# Patient Record
Sex: Female | Born: 1975 | Race: White | Hispanic: No | Marital: Married | Smoking: Never smoker
Health system: Southern US, Community
[De-identification: ages and names within clinical notes are randomized; demographics above are authoritative.]

## PROBLEM LIST (undated history)

## (undated) DIAGNOSIS — Z87440 Personal history of urinary (tract) infections: Secondary | ICD-10-CM

## (undated) DIAGNOSIS — J45909 Unspecified asthma, uncomplicated: Secondary | ICD-10-CM

## (undated) HISTORY — DX: Personal history of urinary (tract) infections: Z87.440

## (undated) HISTORY — DX: Unspecified asthma, uncomplicated: J45.909

---

## 2009-12-15 HISTORY — PX: HERNIA REPAIR: SHX51

## 2010-03-22 ENCOUNTER — Inpatient Hospital Stay: Payer: Self-pay | Admitting: Obstetrics & Gynecology

## 2010-04-24 ENCOUNTER — Ambulatory Visit: Payer: Self-pay | Admitting: General Surgery

## 2012-03-20 ENCOUNTER — Observation Stay: Payer: Self-pay

## 2012-03-26 ENCOUNTER — Inpatient Hospital Stay: Payer: Self-pay | Admitting: Obstetrics and Gynecology

## 2012-03-26 LAB — CBC WITH DIFFERENTIAL/PLATELET
Basophil #: 0 10*3/uL (ref 0.0–0.1)
Basophil %: 0.3 %
Eosinophil #: 0.1 10*3/uL (ref 0.0–0.7)
HCT: 35.2 % (ref 35.0–47.0)
HGB: 12 g/dL (ref 12.0–16.0)
Lymphocyte #: 1.8 10*3/uL (ref 1.0–3.6)
Lymphocyte %: 17.6 %
MCH: 32.1 pg (ref 26.0–34.0)
Monocyte #: 0.6 x10 3/mm (ref 0.2–0.9)
Monocyte %: 5.7 %
Neutrophil #: 8 10*3/uL — ABNORMAL HIGH (ref 1.4–6.5)
Neutrophil %: 75.9 %
Platelet: 209 10*3/uL (ref 150–440)
RBC: 3.75 10*6/uL — ABNORMAL LOW (ref 3.80–5.20)
RDW: 13.2 % (ref 11.5–14.5)
WBC: 10.5 10*3/uL (ref 3.6–11.0)

## 2013-05-06 ENCOUNTER — Ambulatory Visit: Payer: Self-pay | Admitting: Internal Medicine

## 2013-08-05 ENCOUNTER — Ambulatory Visit: Payer: Self-pay | Admitting: Internal Medicine

## 2013-09-23 ENCOUNTER — Ambulatory Visit: Payer: Self-pay | Admitting: Internal Medicine

## 2014-02-21 ENCOUNTER — Ambulatory Visit: Payer: Self-pay | Admitting: Internal Medicine

## 2014-02-24 ENCOUNTER — Telehealth: Payer: Self-pay | Admitting: Internal Medicine

## 2014-02-24 ENCOUNTER — Encounter: Payer: Self-pay | Admitting: Internal Medicine

## 2014-02-24 ENCOUNTER — Encounter (INDEPENDENT_AMBULATORY_CARE_PROVIDER_SITE_OTHER): Payer: Self-pay

## 2014-02-24 ENCOUNTER — Ambulatory Visit (INDEPENDENT_AMBULATORY_CARE_PROVIDER_SITE_OTHER): Payer: No Typology Code available for payment source | Admitting: Internal Medicine

## 2014-02-24 VITALS — BP 108/70 | HR 46 | Temp 97.9°F | Ht 65.0 in | Wt 134.8 lb

## 2014-02-24 DIAGNOSIS — J45909 Unspecified asthma, uncomplicated: Secondary | ICD-10-CM

## 2014-02-24 DIAGNOSIS — Z13220 Encounter for screening for lipoid disorders: Secondary | ICD-10-CM

## 2014-02-24 DIAGNOSIS — Z1239 Encounter for other screening for malignant neoplasm of breast: Secondary | ICD-10-CM

## 2014-02-24 NOTE — Progress Notes (Signed)
Pre-visit discussion using our clinic review tool. No additional management support is needed unless otherwise documented below in the visit note.  

## 2014-02-24 NOTE — Telephone Encounter (Signed)
At checkout Pt states she is to be scheduled for mammogram per Dr. Lorin PicketScott.  States she has never had one.  Would like to go to [place].  Appt can be Friday afternoons only.

## 2014-02-26 ENCOUNTER — Encounter: Payer: Self-pay | Admitting: Internal Medicine

## 2014-02-26 DIAGNOSIS — J45909 Unspecified asthma, uncomplicated: Secondary | ICD-10-CM | POA: Insufficient documentation

## 2014-02-26 NOTE — Assessment & Plan Note (Signed)
Currently doing well.  Breathing doing well.  Follow.

## 2014-02-26 NOTE — Progress Notes (Signed)
   Subjective:    Patient ID: Megan Frey, female    DOB: 03-17-1976, 38 y.o.   MRN: [id number]  HPI 38 year old female with past history of asthma who comes in today to establish care here at [facility].  I saw [facility] at [facility] approximately 10 years.  She has been doing well.  Has been following with [facility].  Up to date with breast, pelvic and pap smears.  She has been training for marathons.  Long distance running.  No chest pain or tightness. No sob.  No acid reflux.  No nausea or vomiting.  No bowel change.  Has two children now - ages 2 and 4.     Past Medical History  Diagnosis Date  . Asthma   . Hx: UTI (urinary tract infection)     Outpatient Encounter Prescriptions as of 02/24/2014  Medication Sig  . ibuprofen (ADVIL,MOTRIN) 200 MG tablet Take 400 mg by mouth 2 (two) times daily as needed.  . [DISCONTINUED] fexofenadine (ALLEGRA) 60 MG tablet Take 60 mg by mouth 2 (two) times daily.  . [DISCONTINUED] Levonorg-Eth Estrad Triphasic (TRIVORA, 28, PO) Take by mouth.    Review of Systems Patient denies any headache, lightheadedness or dizziness.  No sinus or allergy symptoms.  No chest pain, tightness or palpatitions.  No increased shortness of breath, cough or congestion.  No acid reflux.  No nausea or vomiting.  No abdominal pain or cramping.  No bowel change, such as diarrhea, constipation, BRBPR or melana.  No urine change.  Exercises regularly.  No cardiac symptoms with increased activity or exertion.        Objective:   Physical Exam Filed Vitals:   02/24/14 1332  BP: 108/70  Pulse: 46  Temp: 97.9 F (36.6 C)   Pulse recheck 8160  38 year old female in no acute distress.   HEENT:  Nares- clear.  Oropharynx - without lesions. NECK:  Supple.  Nontender.  No audible bruit.  HEART:  Appears to be regular. LUNGS:  No crackles or wheezing audible.  Respirations even and unlabored.  RADIAL PULSE:  Equal bilaterally.    BREASTS:  Performed by gyn.   ABDOMEN:  Soft,  nontender.  Bowel sounds present and normal.  No audible abdominal bruit.  GU:  Performed by gyn.   EXTREMITIES:  No increased edema present.  DP pulses palpable and equal bilaterally.      Assessment & Plan:  HEALTH MAINTENANCE.  Schedule her for a physical when due.  May still get through gyn.  Check routine labs including cholesterol.  Schedule her for a mammogram.    I spent 25 minutes with the patient and more than 50% of the time was spent in consultation regarding the above.

## 2014-02-28 NOTE — Telephone Encounter (Signed)
Noted  

## 2014-03-01 LAB — HM PAP SMEAR: HM Pap smear: NEGATIVE

## 2014-03-10 ENCOUNTER — Other Ambulatory Visit (INDEPENDENT_AMBULATORY_CARE_PROVIDER_SITE_OTHER): Payer: No Typology Code available for payment source

## 2014-03-10 DIAGNOSIS — J45909 Unspecified asthma, uncomplicated: Secondary | ICD-10-CM

## 2014-03-10 DIAGNOSIS — Z13220 Encounter for screening for lipoid disorders: Secondary | ICD-10-CM

## 2014-03-10 LAB — TSH: TSH: 1.93 u[IU]/mL (ref 0.35–5.50)

## 2014-03-10 LAB — CBC WITH DIFFERENTIAL/PLATELET
BASOS ABS: 0 10*3/uL (ref 0.0–0.1)
BASOS PCT: 0.6 % (ref 0.0–3.0)
EOS ABS: 0.1 10*3/uL (ref 0.0–0.7)
Eosinophils Relative: 1.4 % (ref 0.0–5.0)
HCT: 37 % (ref 36.0–46.0)
HEMOGLOBIN: 12.4 g/dL (ref 12.0–15.0)
LYMPHS PCT: 34.2 % (ref 12.0–46.0)
Lymphs Abs: 2.2 10*3/uL (ref 0.7–4.0)
MCHC: 33.5 g/dL (ref 30.0–36.0)
MCV: 92.1 fl (ref 78.0–100.0)
MONOS PCT: 5.4 % (ref 3.0–12.0)
Monocytes Absolute: 0.3 10*3/uL (ref 0.1–1.0)
NEUTROS ABS: 3.7 10*3/uL (ref 1.4–7.7)
NEUTROS PCT: 58.4 % (ref 43.0–77.0)
Platelets: 255 10*3/uL (ref 150.0–400.0)
RBC: 4.02 Mil/uL (ref 3.87–5.11)
RDW: 13.5 % (ref 11.5–14.6)
WBC: 6.4 10*3/uL (ref 4.5–10.5)

## 2014-03-10 LAB — LIPID PANEL
Cholesterol: 132 mg/dL (ref 0–200)
HDL: 72.8 mg/dL (ref >39.00)
LDL CALC: 55 mg/dL (ref 0–99)
Total CHOL/HDL Ratio: 2
Triglycerides: 22 mg/dL (ref 0.0–149.0)
VLDL: 4.4 mg/dL (ref 0.0–40.0)

## 2014-03-10 LAB — COMPREHENSIVE METABOLIC PANEL
ALBUMIN: 4 g/dL (ref 3.5–5.2)
ALT: 21 U/L (ref 0–35)
AST: 22 U/L (ref 0–37)
Alkaline Phosphatase: 44 U/L (ref 39–117)
BUN: 19 mg/dL (ref 6–23)
CALCIUM: 9.3 mg/dL (ref 8.4–10.5)
CHLORIDE: 105 meq/L (ref 96–112)
CO2: 24 meq/L (ref 19–32)
CREATININE: 0.8 mg/dL (ref 0.4–1.2)
GFR: 87.6 mL/min (ref >60.00)
Glucose, Bld: 91 mg/dL (ref 70–99)
POTASSIUM: 4.3 meq/L (ref 3.5–5.1)
Sodium: 137 mEq/L (ref 135–145)
Total Bilirubin: 1 mg/dL (ref 0.3–1.2)
Total Protein: 6.7 g/dL (ref 6.0–8.3)

## 2014-03-13 ENCOUNTER — Encounter: Payer: Self-pay | Admitting: *Deleted

## 2014-03-14 ENCOUNTER — Encounter: Payer: Self-pay | Admitting: Emergency Medicine

## 2014-03-31 ENCOUNTER — Ambulatory Visit: Payer: Self-pay | Admitting: Internal Medicine

## 2014-03-31 LAB — HM MAMMOGRAPHY: HM Mammogram: NEGATIVE

## 2014-04-03 ENCOUNTER — Encounter: Payer: Self-pay | Admitting: Internal Medicine

## 2014-04-04 ENCOUNTER — Ambulatory Visit: Payer: Self-pay | Admitting: Orthopaedic Surgery

## 2014-04-27 ENCOUNTER — Encounter: Payer: Self-pay | Admitting: Adult Health

## 2014-04-27 ENCOUNTER — Ambulatory Visit (INDEPENDENT_AMBULATORY_CARE_PROVIDER_SITE_OTHER): Payer: No Typology Code available for payment source | Admitting: Adult Health

## 2014-04-27 ENCOUNTER — Ambulatory Visit: Payer: No Typology Code available for payment source | Admitting: Adult Health

## 2014-04-27 VITALS — BP 98/62 | HR 62 | Temp 98.0°F | Resp 14 | Wt 132.5 lb

## 2014-04-27 DIAGNOSIS — N926 Irregular menstruation, unspecified: Secondary | ICD-10-CM

## 2014-04-27 DIAGNOSIS — J029 Acute pharyngitis, unspecified: Secondary | ICD-10-CM

## 2014-04-27 LAB — POCT RAPID STREP A (OFFICE): RAPID STREP A SCREEN: NEGATIVE

## 2014-04-27 MED ORDER — AZITHROMYCIN 250 MG PO TABS: ORAL_TABLET | ORAL | Status: DC

## 2014-04-27 NOTE — Patient Instructions (Signed)
  STart Azithromycin 2 tablets today then 1 tablet daily for the next 4 days.  Gargle with salt water or use chloraseptic spray for your throat.  Continue to drink plenty of fluids.  Call if no improvement within 4-5 days.

## 2014-04-27 NOTE — Progress Notes (Signed)
Pre visit review using our clinic review tool, if applicable. No additional management support is needed unless otherwise documented below in the visit note. 

## 2014-04-27 NOTE — Progress Notes (Signed)
   Subjective:    Patient ID: Megan Frey, female    DOB: 02-Feb-1976, 38 y.o.   MRN: [id number]  HPI Pt presents to clinic with sore throat, ear pain, fever, chills. Symptoms started 2 days ago. 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 She also reports irregular menstrual cycle. Normally only has 3 days but had 8 day cycle. She then bleeding for an additional 3 days 2 days after her period stopped. She reports having an appointment with her GYN in June.  Past Medical History  Diagnosis Date  . Asthma   . Hx: UTI (urinary tract infection)       Review of Systems  Constitutional: Positive for fever and chills.  HENT: Positive for congestion, postnasal drip, rhinorrhea, sinus pressure and sore throat.   Respiratory: Positive for cough. Negative for wheezing.   Genitourinary: Positive for menstrual problem (reports irregular menstrual cycle this month. Had for 8 days then off for 3 and then had additional bleeding for few more days.).  All other systems reviewed and are negative.  BP 98/62  Pulse 62  Temp(Src) 98 F (36.7 C) (Oral)  Resp 14  Wt 132 lb 8 oz (60.102 kg)  SpO2 98%     Objective:   Physical Exam  Constitutional: She is oriented to person, place, and time. She appears well-developed and well-nourished. No distress.  HENT:  Head: Normocephalic and atraumatic.  Right Ear: External ear normal.  Left Ear: External ear normal.  Mouth/Throat: No oropharyngeal exudate.  Cardiovascular: Normal rate, regular rhythm and normal heart sounds.  Exam reveals no gallop.   No murmur heard. Pulmonary/Chest: Effort normal and breath sounds normal. No respiratory distress. She has no wheezes. She has no rales.  Neurological: She is alert and oriented to person, place, and time.  Psychiatric: She has a normal mood and affect. Her behavior is normal. Judgment and thought content normal.       Assessment & Plan:   1. Sore throat Strep test was negative. I am treating  her empirically for strep given her report of strep throughout her son's school and her sore throat, fever. Azithromycin. - POCT rapid strep A  2. Menstrual irregularity Suspect transient, isolated episode. No active problem. She has appointment with her GYN in June. Instructed to follow up with them.

## 2014-05-04 ENCOUNTER — Telehealth: Payer: Self-pay | Admitting: Internal Medicine

## 2014-05-04 NOTE — Telephone Encounter (Signed)
Patient Information:  Caller Name: [name]  Phone: [phone]  Patient: Megan Frey, Megan Frey  Gender: Female  DOB: 11/30/1976  Age: [age] Years  PCP: Dale DurhamScott, Charlene  Pregnant: No  Office Follow Up:  Does the office need to follow up with this patient?: No  Instructions For The Office: N/A   Symptoms  Reason For Call & Symptoms: 04/27/14 she saw Belenda Cruiseachel Rae and was treated for strep and an ear infection. Now she is coughing a lot and blowing green mucous. Afebrile.   She is taking 24 hr Sudaphed. She has a headache.  What else can she do?  Pt disconnected  and recalled and message left.  Reviewed Health History In EMR: Yes  Reviewed Medications In EMR: Yes  Reviewed Allergies In EMR: Yes  Reviewed Surgeries / Procedures: Yes  Date of Onset of Symptoms: 04/29/2014  Treatments Tried: Sudaphed and Ibuprofen  Treatments Tried Worked: No OB / GYN:  LMP: 04/20/2014  Guideline(s) Used:  Colds  Disposition Per Guideline:   See Today or Tomorrow in Office  Reason For Disposition Reached:   Sinus congestion (pressure, fullness) present > 10 days  Advice Given:  N/A  RN Overrode Recommendation:  Document Patient  Pt disconneted and recalled and message left.

## 2014-05-04 NOTE — Telephone Encounter (Signed)
See other encounter with this telephone note, forwarded to Raquel.

## 2014-05-04 NOTE — Telephone Encounter (Signed)
Patient Information:  Caller Name: [name]  Phone: [phone]  Patient: Megan Frey, Megan Frey  Gender: Female  DOB: 1976/02/02  Age: 38 Years  PCP: Dale DurhamScott, Charlene  Pregnant: No  Office Follow Up:  Does the office need to follow up with this patient?: No  Instructions For The Office: N/A   Symptoms  Reason For Call & Symptoms: See ealier note fom 1150 this AM 05/04/14. Pt calling back from previous call hwere she disconnected.  Pt  had strep throat and an ear infection.  She still has a cough and is blowing greenish sinus drainage.  She is taking 24 hr Sudaphed. Afebrile and she has a headache.  What else can she do/take?  Reviewed Health History In EMR: Yes  Reviewed Medications In EMR: Yes  Reviewed Allergies In EMR: No  Reviewed Surgeries / Procedures: N/A  Date of Onset of Symptoms: 04/29/2014  Treatments Tried: Sudaphed and Ibuprofen  Treatments Tried Worked: No OB / GYN:  LMP: 04/20/2014  Guideline(s) Used:  Colds  Disposition Per Guideline:   Home Care  Reason For Disposition Reached:   Colds with no complications  Advice Given:  For a Runny Nose With Profuse Discharge:   Nasal mucus and discharge helps to wash viruses and bacteria out of the nose and sinuses.  Blowing the nose is all that is needed.  If the skin around your nostrils gets irritated, apply a tiny amount of petroleum ointment to the nasal openings once or twice a day.  For a Stuffy Nose - Use Nasal Washes:  Introduction: Saline (salt water) nasal irrigation (nasal wash) is an effective and simple home remedy for treating stuffy nose and sinus congestion. The nose can be irrigated by pouring, spraying, or squirting salt water into the nose and then letting it run back out.  How it Helps: The salt water rinses out excess mucus, washes out any irritants (dust, allergens) that might be present, and moistens the nasal cavity.  Treatment for Associated Symptoms of Colds:  For muscle aches, headaches, or moderate  fever (more than 101 F or 38.9 C): Take acetaminophen every 4 hours.  Cough: Use cough drops.  Hydrate: Drink adequate liquids.  Humidifier:  If the air in your home is dry, use a cool-mist humidifier  Contagiousness:  Wash your hands frequently with soap and water.  Expected Course:   Nasal discharge 7-14 days  Cough up to 2-3 weeks.  Medicines for a Stuffy or Runny Nose:  Most cold medicines that are available over-the-counter (OTC) are not helpful.  Antihistamines: Are only helpful if you also have nasal allergies.  If you have a very runny nose and you really think you need a medicine, you can try using a nasal decongestant for a couple days.  Nasal Decongestants for a Very Stuffy or Runny Nose:  If you have a very stuffy nose, nasal decongestant medicines can shrink the swollen nasal mucosa and allow for easier breathing. If you have a very runny nose, these medicines can reduce the amount of drainage. They may be taken as pills by mouth or as a nasal spray.  Patient Will Follow Care Advice:  YES

## 2015-03-02 ENCOUNTER — Encounter: Payer: Self-pay | Admitting: Internal Medicine

## 2015-03-02 ENCOUNTER — Ambulatory Visit (INDEPENDENT_AMBULATORY_CARE_PROVIDER_SITE_OTHER): Payer: No Typology Code available for payment source | Admitting: Internal Medicine

## 2015-03-02 VITALS — BP 106/58 | HR 58 | Resp 14 | Ht 65.0 in | Wt 133.6 lb

## 2015-03-02 DIAGNOSIS — Z0000 Encounter for general adult medical examination without abnormal findings: Secondary | ICD-10-CM

## 2015-03-02 DIAGNOSIS — J45909 Unspecified asthma, uncomplicated: Secondary | ICD-10-CM | POA: Diagnosis not present

## 2015-03-02 DIAGNOSIS — M25562 Pain in left knee: Secondary | ICD-10-CM | POA: Insufficient documentation

## 2015-03-02 DIAGNOSIS — Z91048 Other nonmedicinal substance allergy status: Secondary | ICD-10-CM | POA: Diagnosis not present

## 2015-03-02 DIAGNOSIS — Z9109 Other allergy status, other than to drugs and biological substances: Secondary | ICD-10-CM

## 2015-03-02 NOTE — Progress Notes (Signed)
Patient ID: Megan Frey, female   DOB: [birth year], 39 y.o.   MRN: [id number]   Subjective:    Patient ID: Megan Frey, female    DOB: 1976/08/26, 39 y.o.   MRN: [id number]  HPI  Patient here for a complete physical exam.  Doing well.  Is having problems with her left knee.  Persistent problem.  Worsened over the last two weeks.  Has seen ortho.  Been to a chiropractor.  Still with increased problems.  Discussed physical therapy referral.  Wants to see Su Hoff - [facility] physical therapy.  Limits her running.  Can ride a bike ok.  Some allergies.  Taking zyrtec.  LMP last Monday.  Sees gyn.  Pap ok.     Past Medical History  Diagnosis Date  . Asthma   . Hx: UTI (urinary tract infection)     Current Outpatient Prescriptions on File Prior to Visit  Medication Sig Dispense Refill  . ibuprofen (ADVIL,MOTRIN) 200 MG tablet Take 400 mg by mouth 2 (two) times daily as needed.     No current facility-administered medications on file prior to visit.    Review of Systems  Constitutional: Negative for appetite change and unexpected weight change.  HENT: Negative for congestion and sinus pressure.   Eyes: Negative for pain and visual disturbance.  Respiratory: Negative for cough, chest tightness and shortness of breath.   Cardiovascular: Negative for chest pain, palpitations and leg swelling.  Gastrointestinal: Negative for nausea, vomiting, abdominal pain and diarrhea.  Genitourinary: Negative for dysuria and difficulty urinating.  Musculoskeletal: Negative for back pain.       Left knee pain as outlined.  Persistent.    Skin: Negative for color change and rash.  Neurological: Negative for dizziness, light-headedness and headaches.  Hematological: Negative for adenopathy. Does not bruise/bleed easily.  Psychiatric/Behavioral: Negative for dysphoric mood and agitation.       Objective:     Pulse 60 Physical Exam  Constitutional: She appears well-developed and well-nourished. No  distress.  HENT:  Nose: Nose normal.  Mouth/Throat: Oropharynx is clear and moist.  Eyes: Right eye exhibits no discharge. Left eye exhibits no discharge. No scleral icterus.  Neck: Neck supple. No thyromegaly present.  Cardiovascular: Normal rate and regular rhythm.   Pulmonary/Chest: Breath sounds normal. No respiratory distress. She has no wheezes.  Abdominal: Soft. Bowel sounds are normal. There is no tenderness.  Musculoskeletal: She exhibits no edema or tenderness.  Lymphadenopathy:    She has no cervical adenopathy.  Skin: No rash noted. No erythema.  Psychiatric: She has a normal mood and affect. Her behavior is normal.    BP 106/58 mmHg  Pulse 58  Resp 14  Ht  (1.651 m)  Wt 133 lb 9.6 oz (60.601 kg)  BMI 22.23 kg/m2  SpO2 99% Wt Readings from Last 3 Encounters:  03/02/15 133 lb 9.6 oz (60.601 kg)  04/27/14 132 lb 8 oz (60.102 kg)  02/24/14 134 lb 12 oz (61.122 kg)     Lab Results  Component Value Date   WBC 6.4 03/10/2014   HGB 12.4 03/10/2014   HCT 37.0 03/10/2014   PLT 255.0 03/10/2014   GLUCOSE 91 03/10/2014   CHOL 132 03/10/2014   TRIG 22.0 03/10/2014   HDL 72.80 03/10/2014   LDLCALC 55 03/10/2014   ALT 21 03/10/2014   AST 22 03/10/2014   NA 137 03/10/2014   K 4.3 03/10/2014   CL 105 03/10/2014   CREATININE 0.8 03/10/2014  BUN 19 03/10/2014   CO2 24 03/10/2014   TSH 1.93 03/10/2014       Assessment & Plan:   Problem List Items Addressed This Visit    Asthma    Breathing stable.        Environmental allergies    Taking zyrtec.  Does not feel she needs anything more.  Follow.        Health care maintenance    Mammogram 03/31/14 - birads I.  Gets her pap smears through gyn (Dr Hinton RaoStadler).        Left knee pain - Primary    Persistent knee pain as outlined.  Has seen ortho.   Has seen chiropractor.  Refer to physical therapy.        Relevant Orders   Ambulatory referral to Physical Therapy     I spent 25 minutes with the patient  and more than 50% of the time was spent in consultation regarding the above.     Dale DurhamSCOTT, Bren Borys, MD

## 2015-03-02 NOTE — Progress Notes (Signed)
Pre visit review using our clinic review tool, if applicable. No additional management support is needed unless otherwise documented below in the visit note. 

## 2015-03-11 ENCOUNTER — Encounter: Payer: Self-pay | Admitting: Internal Medicine

## 2015-03-11 DIAGNOSIS — Z0000 Encounter for general adult medical examination without abnormal findings: Secondary | ICD-10-CM | POA: Insufficient documentation

## 2015-03-11 DIAGNOSIS — Z9109 Other allergy status, other than to drugs and biological substances: Secondary | ICD-10-CM | POA: Insufficient documentation

## 2015-03-11 NOTE — Assessment & Plan Note (Signed)
Persistent knee pain as outlined.  Has seen ortho.   Has seen chiropractor.  Refer to physical therapy.

## 2015-03-11 NOTE — Assessment & Plan Note (Signed)
Mammogram 03/31/14 - birads I.  Gets her pap smears through gyn (Dr Hinton RaoStadler).

## 2015-03-11 NOTE — Assessment & Plan Note (Signed)
Breathing stable.

## 2015-03-11 NOTE — Assessment & Plan Note (Signed)
Taking zyrtec.  Does not feel she needs anything more.  Follow.

## 2015-03-28 ENCOUNTER — Encounter
Admit: 2015-03-28 | Disposition: A | Discharge status: Home / Self Care | Payer: Self-pay | Attending: Internal Medicine | Admitting: Internal Medicine

## 2015-04-18 ENCOUNTER — Encounter: Payer: Self-pay | Admitting: Physical Therapy

## 2015-04-18 ENCOUNTER — Ambulatory Visit: Payer: No Typology Code available for payment source | Attending: Internal Medicine | Admitting: Physical Therapy

## 2015-04-18 DIAGNOSIS — M6281 Muscle weakness (generalized): Secondary | ICD-10-CM | POA: Insufficient documentation

## 2015-04-18 DIAGNOSIS — M25659 Stiffness of unspecified hip, not elsewhere classified: Secondary | ICD-10-CM | POA: Diagnosis not present

## 2015-04-18 DIAGNOSIS — M25562 Pain in left knee: Secondary | ICD-10-CM | POA: Insufficient documentation

## 2015-04-18 NOTE — Patient Instructions (Signed)
Pt issued HEP of: psoas tennis ball release in supine, partial squat on single leg.

## 2015-04-18 NOTE — Therapy (Signed)
[facility] [address] [place], [place], [address] Phone: [phone]   Fax:  [phone]  Physical Therapy Treatment  Patient Details  Name: Derrel NipJaime P Ringel MRN: [id number] Date of Birth: 24-Mar-1976 Referring Provider:  Dale DurhamScott, Charlene, MD  Encounter Date: 04/18/2015      PT End of Session - 04/18/15 1133    Visit Number 4   Number of Visits 16   Date for PT Re-Evaluation 05/09/15   PT Start Time 1030   PT Stop Time 1120   PT Time Calculation (min) 50 min   Activity Tolerance Patient tolerated treatment well      Past Medical History  Diagnosis Date  . Asthma   . Hx: UTI (urinary tract infection)     Past Surgical History  Procedure Laterality Date  . Hernia repair  2011    with mesh    There were no vitals filed for this visit.  Visit Diagnosis:  Muscle weakness  Pain in knee joint, left      Subjective Assessment - 04/18/15 1126    Subjective Pt reports she is doing well, able to run 3 miles with minimal pain. noted pain with performing household cleaning tasks and with work activities.   Currently in Pain? No/denies      Objective: pplyometric depth jumps to assess for knee valgus, pt required cuing to avoid knee valgus and would benefit from continued practice for this. With rotational plyometrics pt unable to control knee valgus with cuing. Single leg squat with toe touch behind, to the side, forward. Unable to perform forward with good control despite cuing.  Trigger point dry needling performed on VMO and distal adductors in multiple sites with piston/twist technique.  Psoas release performed B.  Psoas self release performed with tennis ball and 8 lb wt, with leg lifts x10, heel slide x10.  Pt required extensive cuing with exercises to avoid valgus. Reported instances of knee "feeling weird" and one instance of "tweak" with assessment of rotational plyometrics, improved with manual  cuing.         [facility] PT Assessment - 04/18/15 0001    Assessment   Medical Diagnosis Persistent left knee pain   Onset Date 12/15/14   Next MD Visit 05/10/2015   Prior Therapy none   Precautions   Precautions None   Balance Screen   Has the patient fallen in the past 6 months No   Has the patient had a decrease in activity level because of a fear of falling?  No   Is the patient reluctant to leave their home because of a fear of falling?  No                             PT Education - 04/18/15 1132    Education provided Yes   Education Details HEP   Person(s) Educated Patient   Methods Explanation;Demonstration   Comprehension Verbalized understanding;Returned demonstration             PT Long Term Goals - 04/18/15 1139    PT LONG TERM GOAL #1   Title Pt will be able to run 1 mile with no c/o pain to eturn to typical exercise routine   Time 6   Period Weeks   Status On-going   PT LONG TERM GOAL #2   Title Pt will demonstrate decr. knee valgus with step down to decr. functional difficulty with  elevation changes.   Time 6   Period Weeks   Status On-going   PT LONG TERM GOAL #3   Title Pt will score 75 or greater on LEFS indicating improvement in self reported disability due to LE dysfunction   Time 6   Period Weeks   Status On-going               Plan - 04/18/15 1134    Clinical Impression Statement Pt has not yet returned to her normal level of activity and is still limited with back and knee pain with work activities. In session noted poor motor control with L single leg activities, dynamic control lacking on this side. Pt also appears to be having referred knee pain due to prior psoas pain related to previous hernia surgery. Pt would benefit from continued PT to address these issues.   Pt will benefit from skilled therapeutic intervention in order to improve on the following deficits Decreased strength;Decreased mobility;Pain    Rehab Potential Excellent   PT Frequency 2x / week   PT Duration 8 weeks   PT Treatment/Interventions Manual techniques;Therapeutic exercise   PT Next Visit Plan Focus of next session on running progression, address core stability weakness with work tasks with standing activity.        Problem List Patient Active Problem List   Diagnosis Date Noted  . Environmental allergies 03/11/2015  . Health care maintenance 03/11/2015  . Left knee pain 03/02/2015  . Asthma 02/26/2014    Fisher,Benjamin 04/18/2015, 11:43 AM  [facility] [address] [place], [place], [address] Phone: [phone]   Fax:  [phone]

## 2015-04-24 NOTE — H&P (Signed)
L&D Evaluation:  History Expanded:   HPI [age] uyo G21P1001 whose EDC = 03/25/12.  Pt presents with contractions and [facility].  t already sched for induction next week. Pt is GB S pos.    Gravida 2    Term 1    Living 1    Blood Type O positive    Group B Strep Results (Result >5wks must be treated as unknown) positive    Maternal HIV Negative    Maternal Syphilis Ab Nonreactive    Maternal Varicella Immune    Maternal T-Dap Immune    Presents with contractions    Patient's Medical History No Chronic Illness    Patient's Surgical History hernia    Medications Pre Natal Vitamins    Allergies NKDA    Social History none   Exam:   Vital Signs stable    General no apparent distress    Chest clear    Heart normal sinus rhythm    Abdomen gravid, non-tender    Back no CVAT    Reflexes 1+    Pelvic 4-5    Mebranes Intact   Impression:   Impression early labor   Plan:   Comments Will AROM after antibioitcs in   Electronic Signatures: Towana Badgerosenow, Murl Zogg J (MD)  (Signed 12-Apr-13 12:32)  Authored: L&D Evaluation   Last Updated: 12-Apr-13 12:32 by Towana Badgerosenow, Olamae Ferrara J (MD)

## 2015-04-24 NOTE — H&P (Signed)
L&D Evaluation:  History Expanded:   HPI 39 yo G2P1001 at 6639 2/7 weeks, EDD of 03/25/12 per LMP and 9 week US. [facility] to care, ovarian cyst, +GBS. Pt presents for evaluation of contractions, was seen in office yesterday dilated 3.5 cm and membranes stripped. Cervix unchanged this am with irregular ctx. + FM, no LOF or VB    Blood Type O positive    Group B Strep Results (Result >5wks must be treated as unknown) positive    Maternal HIV Negative    Maternal Syphilis Ab Nonreactive    Maternal Varicella Immune    Rubella Results immune    Maternal T-Dap Unknown    Patient's Medical History No Chronic Illness     Medications Pre Natal Vitamins     Allergies NKDA   ROS:   ROS see HPI   Exam:   Vital Signs stable     General no apparent distress    Mental Status clear     Abdomen gravid, tender with contractions, contractions only painful occasionally    Estimated Fetal Weight Average for gestational age    Fetal Position vertex    Edema no edema     Pelvic no external lesions, 4/80/-2    Mebranes Intact    FHT normal rate with no decels, BL 130, + Accels, moderate variability    Ucx irregular   Impression:   Impression early labor   Plan:   Comments D/c home, return to hospital when ctx increased in intensity or SROM occurs.   Electronic Signatures: Vella KohlerBrothers, Cionna Collantes K (CNM)  (Signed 06-Apr-13 08:55)  Authored: L&D Evaluation   Last Updated: 06-Apr-13 08:55 by Vella KohlerBrothers, Loc Feinstein K (CNM)

## 2015-04-25 ENCOUNTER — Encounter: Payer: No Typology Code available for payment source | Admitting: Physical Therapy

## 2015-05-02 ENCOUNTER — Ambulatory Visit: Payer: No Typology Code available for payment source | Admitting: Physical Therapy

## 2015-05-02 DIAGNOSIS — M25562 Pain in left knee: Secondary | ICD-10-CM | POA: Diagnosis not present

## 2015-05-02 DIAGNOSIS — M6281 Muscle weakness (generalized): Secondary | ICD-10-CM

## 2015-05-02 NOTE — Therapy (Signed)
[facility] [address] [place], [place], [address] Phone: [phone]   Fax:  [phone]  Physical Therapy Treatment  Patient Details  Name: Derrel NipJaime P Brislin MRN: [id number] Date of Birth: Dec 28, 1975 Referring Provider:  Dale DurhamScott, Charlene, MD  Encounter Date: 05/02/2015      PT End of Session - 05/02/15 1147    Visit Number 5   Number of Visits 16   Date for PT Re-Evaluation 05/09/15   PT Start Time 1000   PT Stop Time 1045   PT Time Calculation (min) 45 min   Activity Tolerance Patient tolerated treatment well      Past Medical History  Diagnosis Date  . Asthma   . Hx: UTI (urinary tract infection)     Past Surgical History  Procedure Laterality Date  . Hernia repair  2011    with mesh    There were no vitals filed for this visit.  Visit Diagnosis:  Muscle weakness  Pain in knee joint, left      Subjective Assessment - 05/02/15 1146    Subjective Pt reports she tried running 6 miles, had siginficant incr. in knee pain and has been limited in her tolerance for activity since that time.   Currently in Pain? Yes   Pain Score 5    Pain Location Knee   Pain Orientation Left                Objective: Trigger point dry needling performed on distal adductor, VMO, vastus lateralis with piston technique, local twitch response noted and significant incr. In pain with performance. Following this pt reported decr. Pain with full knee extension compared with start of therapy.  Patellar mobs med/lat, inferior 3x1 min each.  Extensive pt education of how to modify running strategy in order to decr. Pain, pt will return to 3 miles 3x per week, after 2 wks of pain free running will incr. To 4 miles per week, will continue to progress along these lines maintaining pain free status with running.  Educated pt on how to perform self mobs on knee when running to minimize pain during running. Pt  verbalized and demonstrated performance of mobs.  Reviewed exercises, encouraged pt to begin using spinning bike to activate VMO.                      PT Long Term Goals - 04/18/15 1139    PT LONG TERM GOAL #1   Title Pt will be able to run 1 mile with no c/o pain to eturn to typical exercise routine   Time 6   Period Weeks   Status On-going   PT LONG TERM GOAL #2   Title Pt will demonstrate decr. knee valgus with step down to decr. functional difficulty with elevation changes.   Time 6   Period Weeks   Status On-going   PT LONG TERM GOAL #3   Title Pt will score 75 or greater on LEFS indicating improvement in self reported disability due to LE dysfunction   Time 6   Period Weeks   Status On-going               Plan - 05/02/15 1148    Clinical Impression Statement Pt very frustrated that she has not been able to run since trying to run a longer distance. return of global knee pain, stiffness and pain in IT band region. Responded well to manual tx in  session with decr. pain when performing exercise, verbalized understanding of running modification and will  benefit from continued PT to address deficits.   Rehab Potential Excellent   PT Frequency 2x / week   PT Duration 8 weeks   PT Treatment/Interventions Manual techniques;Therapeutic exercise        Problem List Patient Active Problem List   Diagnosis Date Noted  . Environmental allergies 03/11/2015  . Health care maintenance 03/11/2015  . Left knee pain 03/02/2015  . Asthma 02/26/2014    Ty Oshima 05/02/2015, 11:54 AM  [facility] [address] [place], [place], [address] Phone: [phone]   Fax:  [phone]

## 2015-05-09 ENCOUNTER — Ambulatory Visit: Payer: No Typology Code available for payment source | Admitting: Physical Therapy

## 2015-05-09 DIAGNOSIS — M25562 Pain in left knee: Secondary | ICD-10-CM | POA: Diagnosis not present

## 2015-05-09 NOTE — Therapy (Signed)
[place] [facility] [address], [place], [address] Phone: [phone]   Fax:  [phone]  Physical Therapy Treatment  Patient Details  Name: Megan Frey MRN: [id number] Date of Birth: [birth year] Referring Provider:  Einar Pheasant, MD  Encounter Date: 05/09/2015      PT End of Session - 05/09/15 1152    Visit Number 6   Number of Visits 16   Date for PT Re-Evaluation 06/09/15   PT Start Time 1100   PT Stop Time 1145   PT Time Calculation (min) 45 min   Activity Tolerance Patient tolerated treatment well      Past Medical History  Diagnosis Date  . Asthma   . Hx: UTI (urinary tract infection)     Past Surgical History  Procedure Laterality Date  . Hernia repair  2011    with mesh    There were no vitals filed for this visit.  Visit Diagnosis:  Pain in knee joint, left - Plan: PT plan of care cert/re-cert      Subjective Assessment - 05/09/15 1151    Subjective Pt has run twice since previous session, pain continues to be significant following running. She is frustrated at the slow rate of rehab.   Currently in Pain? Yes   Pain Score 5    Pain Location Knee   Pain Orientation Left               Objective: Trigger point dry needling performed on distal quad, distal adductor, distal Vastus lateralis, quadratus lumborum.  Tib/femur PAs 3x1 min grade IV STM on glutes.  Pain with gait improved following TDN, pt better able to peform gait with cuing to achieve full extension following needling, which PT encouraged pt to continue with following session.  Overall pain improved from 5/10 to 1/10 following tx session. PT encouraged pt to d/c running for 2 wks to allow acute onset pain to decr. Pt to be seen for followup at this time when she will be reassessed for return to running program.                  PT Education - 05/09/15 1152    Education provided Yes   Education Details educated pt on taking full running break for the next two weeks. pt will be reassessed at that time.   Person(s) Educated Patient   Methods Explanation;Demonstration   Comprehension Verbalized understanding;Returned demonstration             PT Long Term Goals - 05/09/15 1154    PT LONG TERM GOAL #1   Title Pt will be able to run 1 mile with no c/o pain to eturn to typical exercise routine   Baseline Currently pt is able to do this when in a non-acute phase, however today pt is unable to do so.   Time 6   Period Weeks   Status Partially Met   PT LONG TERM GOAL #2   Title Pt will demonstrate decr. knee valgus with step down to decr. functional difficulty with elevation changes.   Time 6   Period Weeks   Status Partially Met   PT LONG TERM GOAL #3   Title Pt will score 75 or greater on LEFS indicating improvement in self reported disability due to LE dysfunction   Time 6   Period Weeks   Status Deferred  Plan - 05/09/15 1153    Clinical Impression Statement Pt appears to be having acute pain s/p recent run. Overall pain has improved but pt is c/o pain with running consistently. Pain is in knee globally and pt also has pain in IT band region. Pt would benefit from additional round of PT to continue to address pain and progress to return to running program for pain free activity.   Pt will benefit from skilled therapeutic intervention in order to improve on the following deficits Decreased strength;Decreased mobility;Pain   Rehab Potential Excellent   PT Frequency 2x / week   PT Duration 8 weeks   PT Treatment/Interventions Manual techniques;Therapeutic exercise        Problem List Patient Active Problem List   Diagnosis Date Noted  . Environmental allergies 03/11/2015  . Health care maintenance 03/11/2015  . Left knee pain 03/02/2015  . Asthma 02/26/2014    Fisher,Benjamin 05/09/2015, 11:57 AM  [facility] [address], [place], [address] Phone: [phone]   Fax:  [phone]

## 2015-05-23 ENCOUNTER — Ambulatory Visit: Payer: No Typology Code available for payment source | Attending: Internal Medicine | Admitting: Physical Therapy

## 2015-05-23 DIAGNOSIS — M25562 Pain in left knee: Secondary | ICD-10-CM | POA: Insufficient documentation

## 2015-05-23 DIAGNOSIS — M6281 Muscle weakness (generalized): Secondary | ICD-10-CM | POA: Insufficient documentation

## 2015-05-23 NOTE — Therapy (Signed)
[place] [facility] [address] [place], [address] Phone: [phone]   Fax:  [phone]  Physical Therapy Treatment  Patient Details  Name: Megan Frey MRN: [id number] Date of Birth: 23-Sep-1976 Referring Provider:  Einar Pheasant, MD  Encounter Date: 05/23/2015      PT End of Session - 05/23/15 0850    Visit Number 7   Number of Visits 16   Date for PT Re-Evaluation 06/09/15   PT Start Time 0800   PT Stop Time 0850   PT Time Calculation (min) 50 min   Activity Tolerance Patient tolerated treatment well      Past Medical History  Diagnosis Date  . Asthma   . Hx: UTI (urinary tract infection)     Past Surgical History  Procedure Laterality Date  . Hernia repair  2011    with mesh    There were no vitals filed for this visit.  Visit Diagnosis:  Pain in knee joint, left      Subjective Assessment - 05/23/15 0804    Subjective Pt reports she has not run for 2 weeks now. She has had no pain while working out. She had one day of significant pain when she was standing for long periods of time and walking slowly. Pt reports pain is significantly better when she wears a small heel, worse when she wears flats.    Currently in Pain? Yes   Pain Score 2    Pain Location Ankle   Pain Orientation Left                      Objective:  IASTM on L IT band longitudinal and Jstrokes using edge tool.   Ankle joint mobs subtalar, malleolar 3x1 min each. Gentle soft tissue performed on tib anterior. Following this and IASTM pt reported 0/10 pain with standing and slow gait, noted decr. Antalgia with gait.  Dry needling performed on T5 region. CPAs grade IV T4-6. Pt reported pain radiating down leg with performance of this. Over the course of 10 min of manual tx pt reported decr. Resting pain in knee with thoracic PA mobs.  Educated pt on cadence as it relates to running, pt currently runs at  160 BPM, encouraged her per recent research to improve this to 180 BPM and educated pt on how to do this using metronome ap. Pt verbalized understanding of this.           PT Education - 05/23/15 0815    Education provided (p) Yes   Education Details (p) educated pt on changing cadence with running to 180 strides perminute.   Person(s) Educated (p) Patient   Methods (p) Explanation   Comprehension (p) Verbalized understanding             PT Long Term Goals - 05/09/15 1154    PT LONG TERM GOAL #1   Title Pt will be able to run 1 mile with no c/o pain to eturn to typical exercise routine   Baseline Currently pt is able to do this when in a non-acute phase, however today pt is unable to do so.   Time 6   Period Weeks   Status Partially Met   PT LONG TERM GOAL #2   Title Pt will demonstrate decr. knee valgus with step down to decr. functional difficulty with elevation changes.   Time 6   Period Weeks   Status Partially Met  PT LONG TERM GOAL #3   Title Pt will score 75 or greater on LEFS indicating improvement in self reported disability due to LE dysfunction   Time 6   Period Weeks   Status Deferred               Plan - 05/23/15 0850    Clinical Impression Statement Improvement in session with gait and single leg stance with decr. pain from 2/10 to 0/10. Pt has been pain free with exercise but continues to have pain with running. PT issued HEP of cadence change for running, pt will try running 3 miles, 3x per week.    Pt will benefit from skilled therapeutic intervention in order to improve on the following deficits Decreased strength;Decreased mobility;Pain   Rehab Potential Excellent   PT Frequency 2x / week   PT Duration 8 weeks   PT Treatment/Interventions Manual techniques;Therapeutic exercise        Problem List Patient Active Problem List   Diagnosis Date Noted  . Environmental allergies 03/11/2015  . Health care maintenance 03/11/2015  . Left  knee pain 03/02/2015  . Asthma 02/26/2014    Fisher,Benjamin 05/23/2015, 8:56 AM  [place] [address] [place], [address] Phone: [phone]   Fax:  [phone]

## 2015-06-13 ENCOUNTER — Ambulatory Visit: Payer: No Typology Code available for payment source | Admitting: Physical Therapy

## 2015-06-13 DIAGNOSIS — M25562 Pain in left knee: Secondary | ICD-10-CM

## 2015-06-13 DIAGNOSIS — M6281 Muscle weakness (generalized): Secondary | ICD-10-CM

## 2015-06-13 NOTE — Therapy (Signed)
[facility] [address] [place], [address] Phone: [phone]   Fax:  [phone]  Physical Therapy Treatment  Patient Details  Name: Megan Frey MRN: [id number] Date of Birth: 1976-10-13 Referring Provider:  Einar Pheasant, MD  Encounter Date: 06/13/2015      PT End of Session - 06/13/15 1152    Visit Number 8   Number of Visits 16   Date for PT Re-Evaluation 07/09/15   PT Start Time 1100   PT Stop Time 1145   PT Time Calculation (min) 45 min   Activity Tolerance Patient tolerated treatment well      Past Medical History  Diagnosis Date  . Asthma   . Hx: UTI (urinary tract infection)     Past Surgical History  Procedure Laterality Date  . Hernia repair  2011    with mesh    There were no vitals filed for this visit.  Visit Diagnosis:  Pain in knee joint, left  Muscle weakness      Subjective Assessment - 06/13/15 1150    Subjective Pt has been continuing to run. OVer the past few days her pain level has been improved with running but with walking has moderate to high degree of pain. Pt is fearful that this pain is due to a pathoanatomical feature however pain is primarily reproduced with muscle palpation.   Currently in Pain? Yes   Pain Score 3    Pain Location Knee   Pain Orientation Left          Objective: Trigger point dry needling performed on vastus lateralis, proximally and medially, as wellas rectus femoris. This treatment was highly painful for pt - she tolerated it well but c/o pain throughout. Following this pain with gait improved from 4/10 to 1/10, as did pain with knee extension.  Manual patellar mobs 3x1 min. Initially pt significantly hypomobile, with extensive mobs improved to =R.  Re-educated pt on performance of slump stretch, extensive education on posture for seated slump going with one component at a time. Pt performed slump which reproduced pain, cued  to bend knees and flex/extend ankle to address limited ROM while still avoiding pain.                       PT Education - 06/13/15 1152    Education provided Yes   Education Details Educated pt on importance of performing slump stretch.   Person(s) Educated Patient   Methods Explanation   Comprehension Verbalized understanding             PT Long Term Goals - 06/13/15 1201    PT LONG TERM GOAL #1   Title Pt will be able to run 1 mile with no c/o pain to eturn to typical exercise routine   Baseline Currently pt is able to do this when in a non-acute phase, however today pt is unable to do so.   Time 8   Period Weeks   Status Partially Met   PT LONG TERM GOAL #2   Title Pt will demonstrate decr. knee valgus with step down to decr. functional difficulty with elevation changes.   Time 8   Period Weeks   Status Partially Met   PT LONG TERM GOAL #3   Title Pt will score 75 or greater on LEFS indicating improvement in self reported disability due to LE dysfunction   Time 8   Period Weeks  Status Deferred               Plan - 06/13/15 1153    Clinical Impression Statement Pt continues to achieve improvements in pain with running within session, however does continue to be limited and to c/o pain with straightening leg. Able to improve this pain from 4/10 at the start of session to 1/10 via trigger point dry needling, slump stretch and soft tissue work on vastus lateralis and IT band. Pt is being treated every other week with education on how to manage symptoms between sessions. Pt does verbalize concern that she has something more sginificant but has been screened in the past for meniscal pathology which has been negative. Pain currently appears to be related to inflammation in knee from running being compressed when she hyperextends knee.   Pt will benefit from skilled therapeutic intervention in order to improve on the following deficits Decreased  strength;Decreased mobility;Pain   PT Frequency 1x / week   PT Duration 8 weeks   PT Treatment/Interventions Manual techniques;Therapeutic exercise        Problem List Patient Active Problem List   Diagnosis Date Noted  . Environmental allergies 03/11/2015  . Health care maintenance 03/11/2015  . Left knee pain 03/02/2015  . Asthma 02/26/2014    Fisher,Benjamin 06/13/2015, 12:03 PM  [facility] [address] [place], [address] Phone: [phone]   Fax:  [phone]

## 2015-06-27 ENCOUNTER — Ambulatory Visit: Payer: No Typology Code available for payment source | Attending: Internal Medicine | Admitting: Physical Therapy

## 2015-06-27 DIAGNOSIS — M25562 Pain in left knee: Secondary | ICD-10-CM | POA: Diagnosis not present

## 2015-06-27 NOTE — Therapy (Signed)
[facility] [address], [place], [address] Phone: [phone]   Fax:  [phone]  Physical Therapy Treatment  Patient Details  Name: Megan Frey MRN: [id number] Date of Birth: [birth year] Referring Provider:  Einar Pheasant, MD  Encounter Date: 06/27/2015      PT End of Session - 06/27/15 1122    Visit Number 9   Number of Visits 16   PT Start Time 1030   PT Stop Time 1115   PT Time Calculation (min) 45 min   Activity Tolerance Patient tolerated treatment well      Past Medical History  Diagnosis Date  . Asthma   . Hx: UTI (urinary tract infection)     Past Surgical History  Procedure Laterality Date  . Hernia repair  2011    with mesh    There were no vitals filed for this visit.  Visit Diagnosis:  Pain in knee joint, left      Subjective Assessment - 06/27/15 1122    Subjective Pt reports she is doing very well, has done several long runs, has no pain with gait and no pain with running. She did have some knee soreness following most recent 4 mile run but it was short in duration.   Currently in Pain? No/denies            Objective: Trigger point dry needling performed on L vastus lateralis, distal quad, distal adductor.  Following this pt tolerated IT band STM. PT performed extensive STM on IT band, quads.  Knee med/lat mobs 3x20 grade IV.  Following manual tx pt reported no sense of "weakness" in her knee. PT encouraged pt to incr. Running to 6 miles every other day depending on response to treatment. Will be seen for one additional session.                          PT Long Term Goals - 06/13/15 1201    PT LONG TERM GOAL #1   Title Pt will be able to run 1 mile with no c/o pain to eturn to typical exercise routine   Baseline Currently pt is able to do this when in a non-acute phase, however today pt is unable to do so.   Time 8   Period Weeks   Status Partially Met   PT LONG TERM GOAL #2   Title Pt will demonstrate decr. knee valgus with step down to decr. functional difficulty with elevation changes.   Time 8   Period Weeks   Status Partially Met   PT LONG TERM GOAL #3   Title Pt will score 75 or greater on LEFS indicating improvement in self reported disability due to LE dysfunction   Time 8   Period Weeks   Status Deferred               Plan - 06/27/15 1123    Clinical Impression Statement Pt has made definite progress in tolerance for walking and exercise. Pt appears to be nearly appropriate for d/c, wlil be seen for one additional session to ensure she is maintaining improvement.        Problem List Patient Active Problem List   Diagnosis Date Noted  . Environmental allergies 03/11/2015  . Health care maintenance 03/11/2015  . Left knee pain 03/02/2015  . Asthma 02/26/2014    Fisher,Benjamin 06/27/2015, 11:28 AM  [facility] [address] [place], [address] Phone: [phone]   Fax:  [phone]

## 2015-07-19 ENCOUNTER — Ambulatory Visit: Payer: No Typology Code available for payment source | Attending: Internal Medicine | Admitting: Physical Therapy

## 2015-07-19 DIAGNOSIS — M25562 Pain in left knee: Secondary | ICD-10-CM | POA: Insufficient documentation

## 2015-07-19 NOTE — Therapy (Signed)
[facility] [address] [place], [address] Phone: [phone]   Fax:  [phone]  July 19, 2015     Physical Therapy Discharge Summary  Patient: Megan Frey  MRN: [id number]  Date of Birth: May 05, 1976   Diagnosis: Pain in knee joint, left Referring Provider:  Dale Esperance, MD  Pt has made full improvement in pain and ability to run. Pt is able to run greater than 12 miles pain free at high intensity sub 8 min mile. Pt would benefit from continuation of HEP to maintain improvements in pain.        Plan - 07/19/15 1337    Clinical Impression Statement Pt is now pain free and is appropriate for d/c. She is running 14 miles per week pain free. Had multiple questions which PT answered regarding return to higher level running.      Sincerely,   Fisher,Benjamin, PT   CC   [facility] [address], [place], [address] Phone: [phone]   Fax:  [phone]

## 2015-10-16 ENCOUNTER — Encounter: Payer: Self-pay | Admitting: Family Medicine

## 2015-10-16 ENCOUNTER — Ambulatory Visit (INDEPENDENT_AMBULATORY_CARE_PROVIDER_SITE_OTHER): Payer: BLUE CROSS/BLUE SHIELD | Admitting: Family Medicine

## 2015-10-16 VITALS — BP 114/72 | HR 67 | Temp 98.4°F | Ht 65.0 in

## 2015-10-16 DIAGNOSIS — J209 Acute bronchitis, unspecified: Secondary | ICD-10-CM | POA: Diagnosis not present

## 2015-10-16 MED ORDER — BENZONATATE 200 MG PO CAPS: 200.0000 mg | ORAL_CAPSULE | Freq: Two times a day (BID) | ORAL | Status: DC | PRN

## 2015-10-16 MED ORDER — ALBUTEROL SULFATE HFA 108 (90 BASE) MCG/ACT IN AERS
2.0000 | INHALATION_SPRAY | Freq: Four times a day (QID) | RESPIRATORY_TRACT | Status: DC | PRN
Start: 2015-10-16 — End: 2016-01-03

## 2015-10-16 NOTE — Patient Instructions (Signed)
Nice to meet you. We will treat you with albuterol for bronchitis. You can take the tessalon for the cough. If this makes you drowsy please let us know.  If you develop chest pain, shortness of breath, blood in your cough, fever, chills, body aches, or any new symptoms or change in symptoms please seek medical care immediately.

## 2015-10-16 NOTE — Progress Notes (Signed)
Pre visit review using our clinic review tool, if applicable. No additional management support is needed unless otherwise documented below in the visit note. 

## 2015-10-16 NOTE — Progress Notes (Signed)
Patient ID: Megan Frey, female   DOB: 11-Mar-1976, 39 y.o.   MRN: [id number]  Marikay AlarEric Sonnenberg, MD Phone: [phone]  Megan Frey is a [age] y.o. female who presents today for same-day appointment.  Patient notes symptoms started last week with sore throat and postnasal drip. She then started to cough. She infrequently has productive cough with yellow mucus. She does note she was wheezing the last 2 nights and tried Proventil and this helped with this. She has a history of asthma as a child, though has not had an asthma exacerbation or wheezing since she was young. She denies chest pain and shortness of breath. She does have a history of allergies and typically has itchy watery eyes, postnasal drip, and rhinorrhea. She has been taking Claritin and Flonase daily since last week. Her symptoms have persisted. She's not had any fevers with this. She feels well. The cough has kept her up at night.  PMH: nonsmoker.   ROS see history of present illness  Objective  Physical Exam Filed Vitals:   10/16/15 1025  BP: 114/72  Pulse: 67  Temp: 98.4 F (36.9 C)    Physical Exam  Constitutional: She is well-developed, well-nourished, and in no distress.  Well-appearing  HENT:  Head: Normocephalic and atraumatic.  Right Ear: External ear normal.  Left Ear: External ear normal.  Mouth/Throat: No oropharyngeal exudate.  Normal TMs bilaterally, mild posterior oropharynx erythema  Eyes: Conjunctivae are normal. Pupils are equal, round, and reactive to light.  Neck: Neck supple.  Cardiovascular: Normal rate, regular rhythm and normal heart sounds.  Exam reveals no gallop and no friction rub.   No murmur heard. Pulmonary/Chest: Effort normal. No respiratory distress. She has wheezes (has a few scattered infrequent expiratory wheezes). She has no rales.  Musculoskeletal: She exhibits no edema.  No calf tenderness or swelling  Lymphadenopathy:    She has no cervical adenopathy.  Neurological:  She is alert. Gait normal.  Skin: Skin is warm and dry. She is not diaphoretic.     Assessment/Plan: Please see individual problem list.  Acute bronchitis Patients cough likely related to viral bronchitis. Her lungs sound relatively clear with very minimal infrequent wheezes. She is moving air well. We'll treat with albuterol inhaler every 4 hours for the next 2 days and then as needed. She has no focal lung sounds or fever or oxygen desaturation to indicate need for antibiotics. Did discuss chest x-ray with her, though we decided to hold off on this at this time. Treat cough with Tessalon. Given return precautions.    Meds ordered this encounter  Medications  . albuterol (PROVENTIL HFA;VENTOLIN HFA) 108 (90 BASE) MCG/ACT inhaler    Sig: Inhale 2 puffs into the lungs every 6 (six) hours as needed for wheezing or shortness of breath.    Dispense:  1 Inhaler    Refill:  0  . benzonatate (TESSALON) 200 MG capsule    Sig: Take 1 capsule (200 mg total) by mouth 2 (two) times daily as needed for cough.    Dispense:  20 capsule    Refill:  0    Marikay AlarEric Sonnenberg

## 2015-10-17 ENCOUNTER — Telehealth: Payer: Self-pay

## 2015-10-17 NOTE — Assessment & Plan Note (Signed)
Patients cough likely related to viral bronchitis. Her lungs sound relatively clear with very minimal infrequent wheezes. She is moving air well. We'll treat with albuterol inhaler every 4 hours for the next 2 days and then as needed. She has no focal lung sounds or fever or oxygen desaturation to indicate need for antibiotics. Did discuss chest x-ray with her, though we decided to hold off on this at this time. Treat cough with Tessalon. Given return precautions.

## 2015-10-17 NOTE — Telephone Encounter (Signed)
PA for Proventil HFA inhaler, started on Cover my meds.

## 2016-01-03 ENCOUNTER — Ambulatory Visit (INDEPENDENT_AMBULATORY_CARE_PROVIDER_SITE_OTHER): Payer: BLUE CROSS/BLUE SHIELD | Admitting: Family Medicine

## 2016-01-03 ENCOUNTER — Ambulatory Visit
Admission: EM | Admit: 2016-01-03 | Discharge: 2016-01-03 | Disposition: A | Discharge status: Home / Self Care | Payer: BLUE CROSS/BLUE SHIELD | Source: Ambulatory Visit | Attending: Family Medicine | Admitting: Family Medicine

## 2016-01-03 ENCOUNTER — Telehealth: Payer: Self-pay | Admitting: Internal Medicine

## 2016-01-03 ENCOUNTER — Encounter: Payer: Self-pay | Admitting: Family Medicine

## 2016-01-03 ENCOUNTER — Ambulatory Visit
Admission: RE | Admit: 2016-01-03 | Discharge: 2016-01-03 | Disposition: A | Discharge status: Home / Self Care | Payer: BLUE CROSS/BLUE SHIELD | Source: Ambulatory Visit | Attending: Family Medicine | Admitting: Family Medicine

## 2016-01-03 VITALS — BP 118/78 | HR 47 | Temp 97.7°F | Ht 65.0 in | Wt 135.2 lb

## 2016-01-03 DIAGNOSIS — R05 Cough: Secondary | ICD-10-CM | POA: Insufficient documentation

## 2016-01-03 DIAGNOSIS — J4530 Mild persistent asthma, uncomplicated: Secondary | ICD-10-CM

## 2016-01-03 DIAGNOSIS — R059 Cough, unspecified: Secondary | ICD-10-CM

## 2016-01-03 MED ORDER — HYDROCOD POLST-CPM POLST ER 10-8 MG/5ML PO SUER: 5.0000 mL | Freq: Two times a day (BID) | ORAL | Status: DC | PRN

## 2016-01-03 MED ORDER — ALBUTEROL SULFATE HFA 108 (90 BASE) MCG/ACT IN AERS: 2.0000 | INHALATION_SPRAY | Freq: Four times a day (QID) | RESPIRATORY_TRACT | Status: DC | PRN

## 2016-01-03 NOTE — Assessment & Plan Note (Addendum)
New problem. Given associated shortness of breath, chest x-ray was obtained. Chest x-ray was independently reviewed. Interpretation: No focal infiltrate.  Treating cough with Tussionex. Advise continue use of Tessalon as needed.

## 2016-01-03 NOTE — Progress Notes (Signed)
Pre visit review using our clinic review tool, if applicable. No additional management support is needed unless otherwise documented below in the visit note. 

## 2016-01-03 NOTE — Patient Instructions (Signed)
Use the tessalon and the tussionex as directed.  We will call with the results of your chest xray.  Follow up closely with your PCP.  Take care  Dr. Adriana Simas

## 2016-01-03 NOTE — Telephone Encounter (Signed)
Pt would like to have her chest xray results.

## 2016-01-03 NOTE — Assessment & Plan Note (Signed)
Patient with recent cough and shortness of breath with physical activity which she does not normally have. Sending for PFTs for evaluation. PRN Albuterol.

## 2016-01-03 NOTE — Progress Notes (Signed)
Subjective:  Patient ID: Megan Frey, female    DOB: 02-03-1976  Age: 40 y.o. MRN: [id number]  CC: Cough  HPI:  40 year old female with a PMH of asthma presents to clinic today with complaints of cough.  Cough  Patient reports a one-week history of cough.  She states her cough has been productive of discolored mucus.  She reports associated malaise and fatigue.  She's also noted that the past several weeks she's had more difficulty with running (ie SOB).   No associated fevers or chills. She has had sick contacts in her children at home.  She's been using Tessalon with some improvement. She's also restarted Flonase.  No known exacerbating factors.  Of note, patient has not been using her rescue inhaler regularly as it is expensive.  Social Hx   Social History   Social History  . Marital Status: Married    Spouse Name: N/A  . Number of Children: N/A  . Years of Education: N/A   Social History Main Topics  . Smoking status: Never Smoker   . Smokeless tobacco: Never Used  . Alcohol Use: No  . Drug Use: No  . Sexual Activity: Not Asked   Other Topics Concern  . None   Social History Narrative   Review of Systems  Constitutional: Positive for fatigue. Negative for fever and chills.  Respiratory: Positive for cough and shortness of breath.    Objective:  BP 118/78 mmHg  Pulse 47  Temp(Src) 97.7 F (36.5 C) (Oral)  Ht  (1.651 m)  Wt 135 lb 4 oz (61.349 kg)  BMI 22.51 kg/m2  SpO2 99%  LMP 12/25/2015 (Exact Date)  BP/Weight 01/03/2016 10/16/2015 03/02/2015  Systolic BP 118 114 106  Diastolic BP 78 72 58  Wt. (Lbs) 135.25 - 133.6  BMI 22.51 - 22.23   Physical Exam  Constitutional: She is oriented to person, place, and time. She appears well-developed. No distress.  HENT:  Head: Normocephalic and atraumatic.  Mouth/Throat: Oropharynx is clear and moist.  Normal TMs bilaterally.  Cardiovascular: Regular rhythm.  Bradycardia present.     Pulmonary/Chest: Effort normal and breath sounds normal. No respiratory distress. She has no wheezes. She has no rales.  Neurological: She is alert and oriented to person, place, and time.  Psychiatric: She has a normal mood and affect.  Vitals reviewed.  Lab Results  Component Value Date   WBC 6.4 03/10/2014   HGB 12.4 03/10/2014   HCT 37.0 03/10/2014   PLT 255.0 03/10/2014   GLUCOSE 91 03/10/2014   CHOL 132 03/10/2014   TRIG 22.0 03/10/2014   HDL 72.80 03/10/2014   LDLCALC 55 03/10/2014   ALT 21 03/10/2014   AST 22 03/10/2014   NA 137 03/10/2014   K 4.3 03/10/2014   CL 105 03/10/2014   CREATININE 0.8 03/10/2014   BUN 19 03/10/2014   CO2 24 03/10/2014   TSH 1.93 03/10/2014    Assessment & Plan:   Problem List Items Addressed This Visit    Asthma    Patient with recent cough and shortness of breath with physical activity which she does not normally have. Sending for PFTs for evaluation. PRN Albuterol.       Relevant Medications   albuterol (PROVENTIL HFA;VENTOLIN HFA) 108 (90 Base) MCG/ACT inhaler   Other Relevant Orders   Pulmonary Function Test [facility] Only   Cough - Primary    New problem. Given associated shortness of breath, chest x-ray was obtained. Chest x-ray was independently  reviewed. Interpretation: No focal infiltrate.  Treating cough with Tussionex. Advise continue use of Tessalon as needed.      Relevant Medications   chlorpheniramine-HYDROcodone (TUSSIONEX PENNKINETIC ER) 10-8 MG/5ML SUER   Other Relevant Orders   DG Chest 2 View (Completed)      Meds ordered this encounter  Medications  . chlorpheniramine-HYDROcodone (TUSSIONEX PENNKINETIC ER) 10-8 MG/5ML SUER    Sig: Take 5 mLs by mouth every 12 (twelve) hours as needed.    Dispense:  115 mL    Refill:  0  . albuterol (PROVENTIL HFA;VENTOLIN HFA) 108 (90 Base) MCG/ACT inhaler    Sig: Inhale 2 puffs into the lungs every 6 (six) hours as needed for wheezing or shortness of breath.     Dispense:  1 Inhaler    Refill:  0    Follow-up: PRN  Everlene Other DO [facility]

## 2016-01-10 ENCOUNTER — Ambulatory Visit: Payer: BLUE CROSS/BLUE SHIELD | Attending: Family Medicine

## 2016-01-10 DIAGNOSIS — J4530 Mild persistent asthma, uncomplicated: Secondary | ICD-10-CM | POA: Diagnosis not present

## 2016-01-10 DIAGNOSIS — J45909 Unspecified asthma, uncomplicated: Secondary | ICD-10-CM | POA: Diagnosis not present

## 2016-01-21 ENCOUNTER — Telehealth: Payer: Self-pay

## 2016-01-21 NOTE — Telephone Encounter (Signed)
Please notify.

## 2016-01-21 NOTE — Telephone Encounter (Signed)
Spoke with the patient, gave PFT results.

## 2016-01-21 NOTE — Telephone Encounter (Signed)
Normal PFTs.

## 2016-01-21 NOTE — Telephone Encounter (Signed)
Pt called and wanted to know what her PFT results were from 01/10/16. Pease advise

## 2016-01-30 ENCOUNTER — Encounter: Payer: Self-pay | Admitting: Family Medicine

## 2016-01-30 ENCOUNTER — Ambulatory Visit (INDEPENDENT_AMBULATORY_CARE_PROVIDER_SITE_OTHER): Payer: BLUE CROSS/BLUE SHIELD | Admitting: Family Medicine

## 2016-01-30 ENCOUNTER — Telehealth: Payer: Self-pay | Admitting: Internal Medicine

## 2016-01-30 VITALS — BP 106/62 | HR 51 | Temp 98.1°F | Ht 65.0 in | Wt 135.5 lb

## 2016-01-30 DIAGNOSIS — G500 Trigeminal neuralgia: Secondary | ICD-10-CM | POA: Diagnosis not present

## 2016-01-30 MED ORDER — HYDROCODONE-ACETAMINOPHEN 5-325 MG PO TABS: 1.0000 | ORAL_TABLET | Freq: Four times a day (QID) | ORAL | Status: DC | PRN

## 2016-01-30 NOTE — Telephone Encounter (Signed)
Dr. Adriana Simas seeing at 1315. Thanks

## 2016-01-30 NOTE — Assessment & Plan Note (Signed)
New problem. Patient's history suggestive of trigeminal neuralgia. Could potentially be cluster headache but trigeminal neuralgia seems more likely. We discussed treatment options and possible referral to neurology (including pharmacotherapy to for trigeminal neuralgia). Patient preferred to see a neurologist. Patient requested pain medication if it became too severe to prevent her from having to go to the emergency department. Vicodin given for severe pain if needed.

## 2016-01-30 NOTE — Patient Instructions (Signed)
Use the pain medication and ibuprofen as needed.  We will call with the referral.   Trigeminal Neuralgia Trigeminal neuralgia is a nerve disorder that causes attacks of severe facial pain. The attacks last from a few seconds to several minutes. They can happen for days, weeks, or months and then go away for months or years. Trigeminal neuralgia is also called tic douloureux. CAUSES This condition is caused by damage to a nerve in the face that is called the trigeminal nerve. An attack can be triggered by:  Talking.  Chewing.  Putting on makeup.  Washing your face.  Shaving your face.  Brushing your teeth.  Touching your face. RISK FACTORS This condition is more likely to develop in:  Women.  People who are 29 years of age or older. SYMPTOMS The main symptom of this condition is pain in the jaw, lips, eyes, nose, scalp, forehead, and face. The pain may be intense, stabbing, electric, or shock-like. DIAGNOSIS This condition is diagnosed with a physical exam. A CT scan or MRI may be done to rule out other conditions that can cause facial pain. TREATMENT This condition may be treated with:  Avoiding the things that trigger your attacks.  Pain medicine.  Surgery. This may be done in severe cases if other medical treatment does not provide relief. HOME CARE INSTRUCTIONS  Take over-the-counter and prescription medicines only as told by your health care provider.  If you wish to get pregnant, talk with your health care provider before you start trying to get pregnant.  Avoid the things that trigger your attacks. It may help to:  Chew on the unaffected side of your mouth.  Avoid touching your face.  Avoid blasts of hot or cold air. SEEK MEDICAL CARE IF:  Your pain medicine is not helping.  You develop new, unexplained symptoms, such as:  Double vision.  Facial weakness.  Changes in hearing or balance.  You become pregnant. SEEK IMMEDIATE MEDICAL CARE  IF:  Your pain is unbearable, and your pain medicine does not help.   This information is not intended to replace advice given to you by your health care provider. Make sure you discuss any questions you have with your health care provider.   Document Released: 11/28/2000 Document Revised: 08/22/2015 Document Reviewed: 03/26/2015 Elsevier Interactive Patient Education [employer]

## 2016-01-30 NOTE — Progress Notes (Signed)
Subjective:  Patient ID: Megan Frey, female    DOB: 01/05/1976  Age: 40 y.o. MRN: [id number]  CC: Headache  HPI:  40 year old female presents to clinic today with complaints of headache.  Patient states that she's had significant right-sided facial pain around the ear and scalp since 2/3. She states that the pain is severe and lasts briefly. She describes it as a sharp/stabbing pain ("Ice pick").  Recently it has become more frequent and is quite severe. She is taken ibuprofen and used heat with minimal improvement. No reports of pain around the orbits. No known inciting factor. She has seen a chiropractor and was told that she has some neck tension. She states that she is seeing an ENT today to ensure is not related to her ears. No other complaints today.  Social Hx   Social History   Social History  . Marital Status: Married    Spouse Name: N/A  . Number of Children: N/A  . Years of Education: N/A   Social History Main Topics  . Smoking status: Never Smoker   . Smokeless tobacco: Never Used  . Alcohol Use: No  . Drug Use: No  . Sexual Activity: Not Asked   Other Topics Concern  . None   Social History Narrative   Review of Systems  Constitutional: Negative.   Neurological:       R sided headache/facial pain.   Objective:  BP 106/62 mmHg  Pulse 51  Temp(Src) 98.1 F (36.7 C) (Oral)  Ht  (1.651 m)  Wt 135 lb 8 oz (61.462 kg)  BMI 22.55 kg/m2  SpO2 99%  LMP 12/25/2015 (Exact Date)  BP/Weight 01/30/2016 01/03/2016 10/16/2015  Systolic BP 106 118 114  Diastolic BP 62 78 72  Wt. (Lbs) 135.5 135.25 -  BMI 22.55 22.51 -   Physical Exam  Constitutional: She is oriented to person, place, and time. She appears well-developed. No distress.  HENT:  Head: Normocephalic and atraumatic.  Oropharynx clear. Normal TM's bilaterally.    Pulmonary/Chest: Effort normal.  Neurological: She is alert and oriented to person, place, and time.  PEERLA. EOMI. CN  intact. 5/5 muscle strength in all extremities.   Psychiatric: She has a normal mood and affect.  Vitals reviewed.  Lab Results  Component Value Date   WBC 6.4 03/10/2014   HGB 12.4 03/10/2014   HCT 37.0 03/10/2014   PLT 255.0 03/10/2014   GLUCOSE 91 03/10/2014   CHOL 132 03/10/2014   TRIG 22.0 03/10/2014   HDL 72.80 03/10/2014   LDLCALC 55 03/10/2014   ALT 21 03/10/2014   AST 22 03/10/2014   NA 137 03/10/2014   K 4.3 03/10/2014   CL 105 03/10/2014   CREATININE 0.8 03/10/2014   BUN 19 03/10/2014   CO2 24 03/10/2014   TSH 1.93 03/10/2014   Assessment & Plan:   Problem List Items Addressed This Visit    Trigeminal neuralgia - Primary    New problem. Patient's history suggestive of trigeminal neuralgia. Could potentially be cluster headache but trigeminal neuralgia seems more likely. We discussed treatment options and possible referral to neurology (including pharmacotherapy to for trigeminal neuralgia). Patient preferred to see a neurologist. Patient requested pain medication if it became too severe to prevent her from having to go to the emergency department. Vicodin given for severe pain if needed.      Relevant Orders   Ambulatory referral to Neurology      Meds ordered this encounter  Medications  .  Loratadine (CLARITIN) 10 MG CAPS    Sig: Take by mouth.  Marland Kitchen HYDROcodone-acetaminophen (NORCO/VICODIN) 5-325 MG tablet    Sig: Take 1 tablet by mouth every 6 (six) hours as needed for moderate pain.    Dispense:  30 tablet    Refill:  0    Follow-up: PRN  Everlene Other DO [facility]

## 2016-01-30 NOTE — Telephone Encounter (Signed)
Patient Name: Megan Frey  DOB: [birth year]    Initial Comment Caller states starting having a pain on the right side of head, off and on stabbing pains for over a week. The last two have been really bad.    Nurse Assessment  Nurse: Vickey Sages, RN, Jacquilin Date/Time (Eastern Time): 01/30/2016 8:05:13 AM  Confirm and document reason for call. If symptomatic, describe symptoms. You must click the next button to save text entered. ---Caller states starting having a pain on the right side of head, off and on stabbing pains for over a week. The last two have been really bad. NO HEADACHE NOW  Has the patient traveled out of the country within the last 30 days? ---No  Does the patient have any new or worsening symptoms? ---Yes  Will a triage be completed? ---Yes  Related visit to physician within the last 2 weeks? ---No  Does the PT have any chronic conditions? (i.e. diabetes, asthma, etc.) ---Yes  List chronic conditions. ---asthma  Is the patient pregnant or possibly pregnant? (Ask all females between the ages of 87-55) ---No  Is this a behavioral health or substance abuse call? ---No     Guidelines    Guideline Title Affirmed Question Affirmed Notes  Headache Headache (all triage questions negative)    Final Disposition User   See PCP When Office is Open (within 3 days) Vickey Sages, RN, Jacquilin    Comments  PCP did not have any appointments- Made appointment with Adriana Simas at 1315   Referrals  REFERRED TO PCP OFFICE   Disagree/Comply: Comply

## 2016-01-30 NOTE — Progress Notes (Signed)
Pre visit review using our clinic review tool, if applicable. No additional management support is needed unless otherwise documented below in the visit note. 

## 2016-01-30 NOTE — Telephone Encounter (Signed)
Patient has been seen in the office today.

## 2016-01-31 ENCOUNTER — Ambulatory Visit: Payer: Self-pay | Admitting: Neurology

## 2016-02-06 ENCOUNTER — Ambulatory Visit: Payer: Self-pay | Admitting: Neurology

## 2016-02-13 ENCOUNTER — Other Ambulatory Visit: Payer: Self-pay | Admitting: Neurology

## 2016-02-13 DIAGNOSIS — G500 Trigeminal neuralgia: Secondary | ICD-10-CM

## 2016-02-29 ENCOUNTER — Ambulatory Visit
Admission: RE | Admit: 2016-02-29 | Discharge: 2016-02-29 | Disposition: A | Discharge status: Home / Self Care | Payer: BLUE CROSS/BLUE SHIELD | Source: Ambulatory Visit | Attending: Neurology | Admitting: Neurology

## 2016-02-29 DIAGNOSIS — G500 Trigeminal neuralgia: Secondary | ICD-10-CM | POA: Diagnosis present

## 2016-02-29 MED ORDER — GADOBENATE DIMEGLUMINE 529 MG/ML IV SOLN
15.0000 mL | Freq: Once | INTRAVENOUS | Status: AC | PRN
Start: 2016-02-29 — End: 2016-02-29
  Administered 2016-02-29: 12 mL via INTRAVENOUS

## 2016-03-04 ENCOUNTER — Encounter: Payer: Self-pay | Admitting: Internal Medicine

## 2016-03-04 ENCOUNTER — Ambulatory Visit (INDEPENDENT_AMBULATORY_CARE_PROVIDER_SITE_OTHER): Payer: BLUE CROSS/BLUE SHIELD | Admitting: Internal Medicine

## 2016-03-04 VITALS — BP 100/64 | HR 53 | Temp 98.1°F | Resp 18 | Ht 64.5 in | Wt 138.0 lb

## 2016-03-04 DIAGNOSIS — Z0000 Encounter for general adult medical examination without abnormal findings: Secondary | ICD-10-CM | POA: Diagnosis not present

## 2016-03-04 DIAGNOSIS — Z9109 Other allergy status, other than to drugs and biological substances: Secondary | ICD-10-CM

## 2016-03-04 DIAGNOSIS — J45909 Unspecified asthma, uncomplicated: Secondary | ICD-10-CM

## 2016-03-04 DIAGNOSIS — G500 Trigeminal neuralgia: Secondary | ICD-10-CM

## 2016-03-04 DIAGNOSIS — R519 Headache, unspecified: Secondary | ICD-10-CM

## 2016-03-04 DIAGNOSIS — R51 Headache: Secondary | ICD-10-CM

## 2016-03-04 NOTE — Progress Notes (Signed)
Patient ID: Megan Frey, female   DOB: 1976-08-24, 40 y.o.   MRN: [id number]   Subjective:    Patient ID: Megan Frey, female    DOB: 1975-12-22, 40 y.o.   MRN: [id number]  HPI  Patient here for her physical exam.  She was recently diagnosed with trigeminal neuralgia.  Saw Dr Sherryll Burger.  Had MRI.  Reviewed.  Ok.  On magnesium for her mild headaches.  Helping.  Trigeminal neuralgia - pain - better.  Reported some fatigue recently.  Feels better now.  Still running.  Breathing better.  Takes antihistamine prn.  Works.  No abdominal pain or cramping.  Eating well.  No nausea or vomiting.  No diarrhea.     Past Medical History  Diagnosis Date  . Asthma   . Hx: UTI (urinary tract infection)    Past Surgical History  Procedure Laterality Date  . Hernia repair  2011    with mesh   Family History  Problem Relation Age of Onset  . Asthma Father   . Mental illness Sister   . Cancer Maternal Grandfather     colon  . Sudden death Maternal Grandfather   . Cancer Paternal Grandfather     lung   Social History   Social History  . Marital Status: Married    Spouse Name: N/A  . Number of Children: N/A  . Years of Education: N/A   Social History Main Topics  . Smoking status: Never Smoker   . Smokeless tobacco: Never Used  . Alcohol Use: No  . Drug Use: No  . Sexual Activity: Not Asked   Other Topics Concern  . None   Social History Narrative    Outpatient Encounter Prescriptions as of 03/04/2016  Medication Sig  . albuterol (PROVENTIL HFA;VENTOLIN HFA) 108 (90 Base) MCG/ACT inhaler Inhale 2 puffs into the lungs every 6 (six) hours as needed for wheezing or shortness of breath.  . fluticasone (FLONASE) 50 MCG/ACT nasal spray Place into the nose.  . ibuprofen (ADVIL,MOTRIN) 200 MG tablet Take 400 mg by mouth 2 (two) times daily as needed.  . Loratadine (CLARITIN) 10 MG CAPS Take by mouth.  Marland Kitchen MAGNESIUM PO Take by mouth.  . [DISCONTINUED] HYDROcodone-acetaminophen (NORCO/VICODIN)  5-325 MG tablet Take 1 tablet by mouth every 6 (six) hours as needed for moderate pain.   No facility-administered encounter medications on file as of 03/04/2016.    Review of Systems  Constitutional: Negative for appetite change and unexpected weight change.  HENT: Negative for congestion and sinus pressure.   Eyes: Negative for pain and visual disturbance.  Respiratory: Negative for cough, chest tightness and shortness of breath.   Cardiovascular: Negative for chest pain, palpitations and leg swelling.  Gastrointestinal: Negative for nausea, vomiting, abdominal pain and diarrhea.  Genitourinary: Negative for dysuria and difficulty urinating.  Musculoskeletal: Negative for back pain and joint swelling.  Skin: Negative for color change and rash.  Neurological: Positive for headaches. Negative for dizziness and light-headedness.  Hematological: Negative for adenopathy. Does not bruise/bleed easily.  Psychiatric/Behavioral: Negative for dysphoric mood and agitation.       Objective:     Blood pressure rechecked by me:  104/68  Physical Exam  Constitutional: She is oriented to person, place, and time. She appears well-developed and well-nourished. No distress.  HENT:  Nose: Nose normal.  Mouth/Throat: Oropharynx is clear and moist.  Eyes: Right eye exhibits no discharge. Left eye exhibits no discharge. No scleral icterus.  Neck: Neck supple. No  thyromegaly present.  Cardiovascular: Normal rate and regular rhythm.   Pulmonary/Chest: Breath sounds normal. No accessory muscle usage. No tachypnea. No respiratory distress. She has no decreased breath sounds. She has no wheezes. She has no rhonchi. Right breast exhibits no inverted nipple, no mass, no nipple discharge and no tenderness (no axillary adenopathy). Left breast exhibits no inverted nipple, no mass, no nipple discharge and no tenderness (no axilarry adenopathy).  Abdominal: Soft. Bowel sounds are normal. There is no tenderness.    Musculoskeletal: She exhibits no edema or tenderness.  Lymphadenopathy:    She has no cervical adenopathy.  Neurological: She is alert and oriented to person, place, and time.  Skin: Skin is warm. No rash noted. No erythema.  Psychiatric: She has a normal mood and affect. Her behavior is normal.    BP 100/64 mmHg  Pulse 53  Temp(Src) 98.1 F (36.7 C) (Oral)  Resp 18  Ht 5' 4.5" (1.638 m)  Wt 138 lb (62.596 kg)  BMI 23.33 kg/m2  SpO2 98%  LMP 02/19/2016 (Exact Date) Wt Readings from Last 3 Encounters:  03/04/16 138 lb (62.596 kg)  01/30/16 135 lb 8 oz (61.462 kg)  01/03/16 135 lb 4 oz (61.349 kg)     Lab Results  Component Value Date   WBC 6.4 03/10/2014   HGB 12.4 03/10/2014   HCT 37.0 03/10/2014   PLT 255.0 03/10/2014   GLUCOSE 91 03/10/2014   CHOL 132 03/10/2014   TRIG 22.0 03/10/2014   HDL 72.80 03/10/2014   LDLCALC 55 03/10/2014   ALT 21 03/10/2014   AST 22 03/10/2014   NA 137 03/10/2014   K 4.3 03/10/2014   CL 105 03/10/2014   CREATININE 0.8 03/10/2014   BUN 19 03/10/2014   CO2 24 03/10/2014   TSH 1.93 03/10/2014    Mr Brain W Wo Contrast  02/29/2016  CLINICAL DATA:  40 year old female with headaches, right temple and ear region pain. Symptoms since February 3rd. Initial encounter. "Trigeminal neuralgia" with brain protocol specifically requested. EXAM: MRI HEAD WITHOUT AND WITH CONTRAST TECHNIQUE: Multiplanar, multiecho pulse sequences of the brain and surrounding structures were obtained without and with intravenous contrast. CONTRAST:  12mL MULTIHANCE GADOBENATE DIMEGLUMINE 529 MG/ML IV SOLN COMPARISON:  None. FINDINGS: Cerebral volume is within normal limits. No restricted diffusion to suggest acute infarction. No midline shift, mass effect, evidence of mass lesion, ventriculomegaly, extra-axial collection or acute intracranial hemorrhage. Cervicomedullary junction and pituitary are within normal limits. Negative visualized cervical spine. Major intracranial  vascular flow voids are within normal limits. Wallace Cullens and white matter signal is within normal limits for age throughout the brain. No definite encephalomalacia. No chronic cerebral blood products. No abnormal enhancement identified. No dural thickening. The cisternal fifth nerve segments, Meckel cave, and cavernous sinuses appear normal. Visible internal auditory structures appear normal. Tympanic cavities appear clear. Mastoids pneumatization is within normal limits. Normal stylomastoid foramina. Trace ethmoid and left maxillary sinus mucosal thickening. Orbit and scalp soft tissues appear normal. Visualized bone marrow signal is within normal limits. No osseous abnormality identified. IMPRESSION: Normal MRI appearance of the brain. No explanation for headache or ear pain identified. Electronically Signed   By: Odessa Fleming M.D.   On: 02/29/2016 10:27       Assessment & Plan:   Problem List Items Addressed This Visit    Asthma - Primary    Breathing stable.  Uses albuterol prn.  Follow.        Environmental allergies    claritin and  flonase.  Controlled.        Headache    On magnesium. Has helped.  Follow.  Saw Dr Sherryll BurgerShah.       Health care maintenance    Physical today 03/04/16.  Had mammogram 03/31/14 - Birads I.  Gets her pap smears through gyn (Dr Hinton RaoStadler).        Trigeminal neuralgia    Saw neurology.  See note for details.  Pain better.  Taking magnesium.  Follow.            Dale DurhamSCOTT, Lesette Frary, MD

## 2016-03-04 NOTE — Progress Notes (Signed)
Pre-visit discussion using our clinic review tool. No additional management support is needed unless otherwise documented below in the visit note.  

## 2016-03-10 ENCOUNTER — Encounter: Payer: Self-pay | Admitting: Internal Medicine

## 2016-03-10 DIAGNOSIS — R51 Headache: Secondary | ICD-10-CM

## 2016-03-10 DIAGNOSIS — R519 Headache, unspecified: Secondary | ICD-10-CM | POA: Insufficient documentation

## 2016-03-10 NOTE — Assessment & Plan Note (Signed)
Saw neurology.  See note for details.  Pain better.  Taking magnesium.  Follow.

## 2016-03-10 NOTE — Assessment & Plan Note (Signed)
Breathing stable.  Uses albuterol prn.  Follow.   

## 2016-03-10 NOTE — Assessment & Plan Note (Signed)
On magnesium. Has helped.  Follow.  Saw Dr Sherryll BurgerShah.

## 2016-03-10 NOTE — Assessment & Plan Note (Signed)
claritin and flonase.  Controlled.

## 2016-03-10 NOTE — Assessment & Plan Note (Signed)
Physical today 03/04/16.  Had mammogram 03/31/14 - Birads I.  Gets her pap smears through gyn (Dr Hinton RaoStadler).

## 2016-10-30 ENCOUNTER — Encounter: Payer: Self-pay | Admitting: Family

## 2016-10-30 ENCOUNTER — Ambulatory Visit (INDEPENDENT_AMBULATORY_CARE_PROVIDER_SITE_OTHER): Payer: BLUE CROSS/BLUE SHIELD | Admitting: Family

## 2016-10-30 VITALS — BP 110/70 | HR 53 | Temp 98.7°F | Ht 64.5 in | Wt 140.2 lb

## 2016-10-30 DIAGNOSIS — J209 Acute bronchitis, unspecified: Secondary | ICD-10-CM

## 2016-10-30 MED ORDER — AZITHROMYCIN 250 MG PO TABS
ORAL_TABLET | ORAL | 0 refills | Status: DC
Start: 2016-10-30 — End: 2017-03-05

## 2016-10-30 MED ORDER — ALBUTEROL SULFATE HFA 108 (90 BASE) MCG/ACT IN AERS: 2.0000 | INHALATION_SPRAY | Freq: Four times a day (QID) | RESPIRATORY_TRACT | 1 refills | Status: DC | PRN

## 2016-10-30 MED ORDER — AZITHROMYCIN 250 MG PO TABS: ORAL_TABLET | ORAL | 0 refills | Status: DC

## 2016-10-30 NOTE — Progress Notes (Signed)
Subjective:    Patient ID: Megan Frey, female    DOB: [birth year], 40 y.o.   MRN: [id number]  CC: Megan Frey is a 40 y.o. female who presents today for an acute visit.    HPI: Patient here for acute visit with chief complaint of productive cough, congestion for past week and half. Unchanged. Children have been sick.  Endorses SOB, wheezing, HA.  No fevers, chills. History of asthma and seasonal allergies.  Congestion and cough worsening facial pain; diagnosis of trigiminal neuralgia. Has tried mucinex and robitussin without much relief.     HISTORY:  Past Medical History:  Diagnosis Date  . Asthma   . Hx: UTI (urinary tract infection)    Past Surgical History:  Procedure Laterality Date  . HERNIA REPAIR  2011   with mesh   Family History  Problem Relation Age of Onset  . Asthma Father   . Mental illness Sister   . Cancer Maternal Grandfather     colon  . Sudden death Maternal Grandfather   . Cancer Paternal Grandfather     lung    Allergies: Patient has no known allergies. Current Outpatient Prescriptions on File Prior to Visit  Medication Sig Dispense Refill  . fluticasone (FLONASE) 50 MCG/ACT nasal spray Place into the nose.    . ibuprofen (ADVIL,MOTRIN) 200 MG tablet Take 400 mg by mouth 2 (two) times daily as needed.    . Loratadine (CLARITIN) 10 MG CAPS Take by mouth.    Marland Kitchen. MAGNESIUM PO Take by mouth.     No current facility-administered medications on file prior to visit.     Social History  Substance Use Topics  . Smoking status: Never Smoker  . Smokeless tobacco: Never Used  . Alcohol use No    Review of Systems  Constitutional: Negative for chills and fever.  HENT: Positive for congestion.   Respiratory: Positive for cough, shortness of breath and wheezing.   Cardiovascular: Negative for chest pain and palpitations.  Gastrointestinal: Negative for nausea and vomiting.  Neurological: Positive for headaches.      Objective:    BP 110/70    Pulse (!) 53   Temp 98.7 F (37.1 C) (Oral)   Ht 5' 4.5" (1.638 m)   Wt 140 lb 3.2 oz (63.6 kg)   SpO2 98%   BMI 23.69 kg/m    Physical Exam  Constitutional: She appears well-developed and well-nourished.  HENT:  Head: Normocephalic and atraumatic.  Right Ear: Hearing, tympanic membrane, external ear and ear canal normal. No drainage, swelling or tenderness. No foreign bodies. Tympanic membrane is not erythematous and not bulging. No middle ear effusion. No decreased hearing is noted.  Left Ear: Hearing, tympanic membrane, external ear and ear canal normal. No drainage, swelling or tenderness. No foreign bodies. Tympanic membrane is not erythematous and not bulging.  No middle ear effusion. No decreased hearing is noted.  Nose: Nose normal. No rhinorrhea. Right sinus exhibits no maxillary sinus tenderness and no frontal sinus tenderness. Left sinus exhibits no maxillary sinus tenderness and no frontal sinus tenderness.  Mouth/Throat: Uvula is midline, oropharynx is clear and moist and mucous membranes are normal. No oropharyngeal exudate, posterior oropharyngeal edema, posterior oropharyngeal erythema or tonsillar abscesses.  Eyes: Conjunctivae are normal.  Cardiovascular: Regular rhythm, normal heart sounds and normal pulses.   Pulmonary/Chest: Effort normal and breath sounds normal. She has no wheezes. She has no rhonchi. She has no rales.  Lymphadenopathy:  Head (right side): No submental, no submandibular, no tonsillar, no preauricular, no posterior auricular and no occipital adenopathy present.       Head (left side): No submental, no submandibular, no tonsillar, no preauricular, no posterior auricular and no occipital adenopathy present.    She has no cervical adenopathy.  Neurological: She is alert.  Skin: Skin is warm and dry.  Psychiatric: She has a normal mood and affect. Her speech is normal and behavior is normal. Thought content normal.  Vitals reviewed.        Assessment & Plan:  1. Acute bronchitis, unspecified organism Afebrile. No adventitious lung sounds. Sa02 98. Based on duration of symptoms, patient and I jointly agreed to start antibiotic therapy. Encouraged use of albuterol particularly before she runs due to ahistory of asthma. Patient will let me know should she is not better, we will consider a short course of prednisone at that time.  - azithromycin (ZITHROMAX) 250 MG tablet; Tale 500 mg PO on day 1, then 250 mg PO q24h x 4 days.  Dispense: 6 tablet; Refill: 0 - albuterol (PROVENTIL HFA) 108 (90 Base) MCG/ACT inhaler; Inhale 2 puffs into the lungs every 6 (six) hours as needed for wheezing or shortness of breath.  Dispense: 1 Inhaler; Refill: 1      I have discontinued Ms. Gilmer's albuterol. I am also having her maintain her ibuprofen, Loratadine, fluticasone, and MAGNESIUM PO.   No orders of the defined types were placed in this encounter.   Return precautions given.   Risks, benefits, and alternatives of the medications and treatment plan prescribed today were discussed, and patient expressed understanding.   Education regarding symptom management and diagnosis given to patient on AVS.  Continue to follow with Dale DurhamSCOTT, CHARLENE, MD for routine health maintenance.   Cameron ProudJaime P Hedger and I agreed with plan.   Rennie PlowmanMargaret Nitara Szczerba, FNP

## 2016-10-30 NOTE — Patient Instructions (Signed)
Pleasure seeing you. Please let me know if you're not better. Megan Frey

## 2016-10-30 NOTE — Progress Notes (Signed)
Pre visit review using our clinic review tool, if applicable. No additional management support is needed unless otherwise documented below in the visit note. 

## 2017-03-05 ENCOUNTER — Encounter: Payer: Self-pay | Admitting: Internal Medicine

## 2017-03-05 ENCOUNTER — Ambulatory Visit (INDEPENDENT_AMBULATORY_CARE_PROVIDER_SITE_OTHER): Payer: BLUE CROSS/BLUE SHIELD | Admitting: Internal Medicine

## 2017-03-05 VITALS — BP 118/72 | HR 50 | Temp 98.8°F | Resp 16 | Ht 64.5 in | Wt 140.6 lb

## 2017-03-05 DIAGNOSIS — J209 Acute bronchitis, unspecified: Secondary | ICD-10-CM

## 2017-03-05 DIAGNOSIS — Z0000 Encounter for general adult medical examination without abnormal findings: Secondary | ICD-10-CM | POA: Diagnosis not present

## 2017-03-05 DIAGNOSIS — G500 Trigeminal neuralgia: Secondary | ICD-10-CM

## 2017-03-05 DIAGNOSIS — Z13220 Encounter for screening for lipoid disorders: Secondary | ICD-10-CM | POA: Diagnosis not present

## 2017-03-05 DIAGNOSIS — Z124 Encounter for screening for malignant neoplasm of cervix: Secondary | ICD-10-CM | POA: Diagnosis not present

## 2017-03-05 MED ORDER — ALBUTEROL SULFATE HFA 108 (90 BASE) MCG/ACT IN AERS: 2.0000 | INHALATION_SPRAY | Freq: Four times a day (QID) | RESPIRATORY_TRACT | 1 refills | Status: AC | PRN

## 2017-03-05 MED ORDER — FLUTICASONE PROPIONATE 50 MCG/ACT NA SUSP: 2.0000 | Freq: Every day | NASAL | 2 refills | Status: AC

## 2017-03-05 NOTE — Progress Notes (Signed)
Patient ID: CAMAY PEDIGO, female   DOB: Sep 06, 1976, 41 y.o.   MRN: [id number]   Subjective:    Patient ID: [name], female    DOB: [birth year], 41 y.o.   MRN: [id number]  HPI  Patient here for her physical exam.  States she is doing well.  Her trigeminal neuralgia is not a significant issue for her now.  No headache.  She is exercising regularly.  No chest pain.  No sob.  No acid reflux.  No abdominal pain or cramping.  Bowels stable.  Regular periods.  LMP last week.    Past Medical History:  Diagnosis Date  . Asthma   . Hx: UTI (urinary tract infection)    Past Surgical History:  Procedure Laterality Date  . HERNIA REPAIR  2011   with mesh   Family History  Problem Relation Age of Onset  . Asthma Father   . Mental illness Sister   . Cancer Maternal Grandfather     colon  . Sudden death Maternal Grandfather   . Cancer Paternal Grandfather     lung   Social History   Social History  . Marital status: Married    Spouse name: N/A  . Number of children: N/A  . Years of education: N/A   Social History Main Topics  . Smoking status: Never Smoker  . Smokeless tobacco: Never Used  . Alcohol use No  . Drug use: No  . Sexual activity: Not Asked   Other Topics Concern  . None   Social History Narrative  . None    Outpatient Encounter Prescriptions as of 03/05/2017  Medication Sig  . albuterol (PROVENTIL HFA) 108 (90 Base) MCG/ACT inhaler Inhale 2 puffs into the lungs every 6 (six) hours as needed for wheezing or shortness of breath.  . fluticasone (FLONASE) 50 MCG/ACT nasal spray Place 2 sprays into both nostrils daily.  Marland Kitchen ibuprofen (ADVIL,MOTRIN) 200 MG tablet Take 400 mg by mouth 2 (two) times daily as needed.  . Loratadine (CLARITIN) 10 MG CAPS Take by mouth.  Marland Kitchen MAGNESIUM PO Take by mouth.  . [DISCONTINUED] albuterol (PROVENTIL HFA) 108 (90 Base) MCG/ACT inhaler Inhale 2 puffs into the lungs every 6 (six) hours as needed for wheezing or shortness of breath.    . [DISCONTINUED] fluticasone (FLONASE) 50 MCG/ACT nasal spray Place into the nose.  . [DISCONTINUED] azithromycin (ZITHROMAX) 250 MG tablet Tale 500 mg PO on day 1, then 250 mg PO q24h x 4 days. (Patient not taking: Reported on 03/05/2017)   No facility-administered encounter medications on file as of 03/05/2017.     Review of Systems  Constitutional: Negative for appetite change and unexpected weight change.  HENT: Negative for congestion, sinus pressure and sore throat.   Eyes: Negative for pain and visual disturbance.  Respiratory: Negative for cough, chest tightness and shortness of breath.   Cardiovascular: Negative for chest pain, palpitations and leg swelling.  Gastrointestinal: Negative for abdominal pain, diarrhea, nausea and vomiting.  Genitourinary: Negative for difficulty urinating and dysuria.  Musculoskeletal: Negative for back pain and joint swelling.  Skin: Negative for color change and rash.  Neurological: Negative for dizziness, light-headedness and headaches.  Hematological: Negative for adenopathy. Does not bruise/bleed easily.  Psychiatric/Behavioral: Negative for agitation and dysphoric mood.       Objective:    Physical Exam  Constitutional: She is oriented to person, place, and time. She appears well-developed and well-nourished. No distress.  HENT:  Nose: Nose normal.  Mouth/Throat: Oropharynx  is clear and moist.  Eyes: Right eye exhibits no discharge. Left eye exhibits no discharge. No scleral icterus.  Neck: Neck supple. No thyromegaly present.  Cardiovascular: Normal rate and regular rhythm.   Pulmonary/Chest: Breath sounds normal. No accessory muscle usage. No tachypnea. No respiratory distress. She has no decreased breath sounds. She has no wheezes. She has no rhonchi. Right breast exhibits no inverted nipple, no mass, no nipple discharge and no tenderness (no axillary adenopathy). Left breast exhibits no inverted nipple, no mass, no nipple discharge and  no tenderness (no axilarry adenopathy).  Abdominal: Soft. Bowel sounds are normal. There is no tenderness.  Genitourinary:  Genitourinary Comments: Normal external genitalia.  Vaginal vault without lesions.  Cervix identified.  Pap smear performed.  Could not appreciate any adnexal masses or tenderness.    Musculoskeletal: She exhibits no edema or tenderness.  Lymphadenopathy:    She has no cervical adenopathy.  Neurological: She is alert and oriented to person, place, and time.  Skin: Skin is warm. No rash noted. No erythema.  Psychiatric: She has a normal mood and affect. Her behavior is normal.    BP 118/72 (BP Location: Left Arm, Patient Position: Sitting, Cuff Size: Normal)   Pulse (!) 50   Temp 98.8 F (37.1 C) (Oral)   Resp 16   Ht 5' 4.5" (1.638 m)   Wt 140 lb 9.6 oz (63.8 kg)   LMP 02/25/2017   SpO2 99%   BMI 23.76 kg/m  Wt Readings from Last 3 Encounters:  03/05/17 140 lb 9.6 oz (63.8 kg)  10/30/16 140 lb 3.2 oz (63.6 kg)  03/04/16 138 lb (62.6 kg)     Lab Results  Component Value Date   WBC 6.4 03/10/2014   HGB 12.4 03/10/2014   HCT 37.0 03/10/2014   PLT 255.0 03/10/2014   GLUCOSE 91 03/10/2014   CHOL 132 03/10/2014   TRIG 22.0 03/10/2014   HDL 72.80 03/10/2014   LDLCALC 55 03/10/2014   ALT 21 03/10/2014   AST 22 03/10/2014   NA 137 03/10/2014   K 4.3 03/10/2014   CL 105 03/10/2014   CREATININE 0.8 03/10/2014   BUN 19 03/10/2014   CO2 24 03/10/2014   TSH 1.93 03/10/2014    Mr Brain W ZO Contrast  Result Date: 02/29/2016 CLINICAL DATA:  41 year old female with headaches, right temple and ear region pain. Symptoms since February 3rd. Initial encounter. "Trigeminal neuralgia" with brain protocol specifically requested. EXAM: MRI HEAD WITHOUT AND WITH CONTRAST TECHNIQUE: Multiplanar, multiecho pulse sequences of the brain and surrounding structures were obtained without and with intravenous contrast. CONTRAST:  12mL MULTIHANCE GADOBENATE DIMEGLUMINE 529  MG/ML IV SOLN COMPARISON:  None. FINDINGS: Cerebral volume is within normal limits. No restricted diffusion to suggest acute infarction. No midline shift, mass effect, evidence of mass lesion, ventriculomegaly, extra-axial collection or acute intracranial hemorrhage. Cervicomedullary junction and pituitary are within normal limits. Negative visualized cervical spine. Major intracranial vascular flow voids are within normal limits. Wallace Cullens and white matter signal is within normal limits for age throughout the brain. No definite encephalomalacia. No chronic cerebral blood products. No abnormal enhancement identified. No dural thickening. The cisternal fifth nerve segments, Meckel cave, and cavernous sinuses appear normal. Visible internal auditory structures appear normal. Tympanic cavities appear clear. Mastoids pneumatization is within normal limits. Normal stylomastoid foramina. Trace ethmoid and left maxillary sinus mucosal thickening. Orbit and scalp soft tissues appear normal. Visualized bone marrow signal is within normal limits. No osseous abnormality identified. IMPRESSION: Normal MRI  appearance of the brain. No explanation for headache or ear pain identified. Electronically Signed   By: Odessa FlemingH  Hall M.D.   On: 02/29/2016 10:27       Assessment & Plan:   Problem List Items Addressed This Visit    Health care maintenance    Physical today 03/05/17.        Trigeminal neuralgia    Saw neurology.  Not a significant issue for her now.  Follow.        Relevant Orders   CBC with Differential/Platelet   Comprehensive metabolic panel   TSH    Other Visit Diagnoses    Routine general medical examination at a health care facility    -  Primary   Acute bronchitis, unspecified organism       Relevant Medications   albuterol (PROVENTIL HFA) 108 (90 Base) MCG/ACT inhaler   Screening cholesterol level       Relevant Orders   Lipid panel       Dale DurhamSCOTT, Mailen Newborn, MD

## 2017-03-05 NOTE — Progress Notes (Signed)
Pre-visit discussion using our clinic review tool. No additional management support is needed unless otherwise documented below in the visit note.  

## 2017-03-07 ENCOUNTER — Encounter: Payer: Self-pay | Admitting: Internal Medicine

## 2017-03-07 NOTE — Assessment & Plan Note (Signed)
Saw neurology.  Not a significant issue for her now.  Follow.

## 2017-03-07 NOTE — Assessment & Plan Note (Signed)
Physical today 03/05/17.

## 2017-03-09 ENCOUNTER — Other Ambulatory Visit (HOSPITAL_COMMUNITY)
Admission: RE | Admit: 2017-03-09 | Discharge: 2017-03-09 | Disposition: A | Discharge status: Home / Self Care | Payer: BLUE CROSS/BLUE SHIELD | Source: Ambulatory Visit | Attending: Internal Medicine | Admitting: Internal Medicine

## 2017-03-09 DIAGNOSIS — Z124 Encounter for screening for malignant neoplasm of cervix: Secondary | ICD-10-CM | POA: Diagnosis not present

## 2017-03-09 NOTE — Addendum Note (Signed)
Addended by: Donnamarie PoagHOMPSON, Kynsie Falkner Y on: 03/09/2017 09:16 AM   Modules accepted: Orders

## 2017-03-10 LAB — CYTOLOGY - PAP
DIAGNOSIS: NEGATIVE
HPV: NOT DETECTED

## 2017-03-12 ENCOUNTER — Other Ambulatory Visit (INDEPENDENT_AMBULATORY_CARE_PROVIDER_SITE_OTHER): Payer: BLUE CROSS/BLUE SHIELD

## 2017-03-12 DIAGNOSIS — Z13220 Encounter for screening for lipoid disorders: Secondary | ICD-10-CM

## 2017-03-12 DIAGNOSIS — G500 Trigeminal neuralgia: Secondary | ICD-10-CM | POA: Diagnosis not present

## 2017-03-12 LAB — CBC WITH DIFFERENTIAL/PLATELET
BASOS PCT: 0.5 % (ref 0.0–3.0)
Basophils Absolute: 0 10*3/uL (ref 0.0–0.1)
EOS PCT: 0.7 % (ref 0.0–5.0)
Eosinophils Absolute: 0.1 10*3/uL (ref 0.0–0.7)
HCT: 37.1 % (ref 36.0–46.0)
Hemoglobin: 12.4 g/dL (ref 12.0–15.0)
LYMPHS ABS: 2 10*3/uL (ref 0.7–4.0)
Lymphocytes Relative: 21.7 % (ref 12.0–46.0)
MCHC: 33.4 g/dL (ref 30.0–36.0)
MCV: 91.9 fl (ref 78.0–100.0)
MONO ABS: 0.6 10*3/uL (ref 0.1–1.0)
MONOS PCT: 6.1 % (ref 3.0–12.0)
NEUTROS ABS: 6.5 10*3/uL (ref 1.4–7.7)
NEUTROS PCT: 71 % (ref 43.0–77.0)
Platelets: 274 10*3/uL (ref 150.0–400.0)
RBC: 4.04 Mil/uL (ref 3.87–5.11)
RDW: 13.3 % (ref 11.5–15.5)
WBC: 9.2 10*3/uL (ref 4.0–10.5)

## 2017-03-12 LAB — COMPREHENSIVE METABOLIC PANEL
ALK PHOS: 47 U/L (ref 39–117)
ALT: 15 U/L (ref 0–35)
AST: 22 U/L (ref 0–37)
Albumin: 4.4 g/dL (ref 3.5–5.2)
BUN: 17 mg/dL (ref 6–23)
CHLORIDE: 103 meq/L (ref 96–112)
CO2: 28 mEq/L (ref 19–32)
Calcium: 9.6 mg/dL (ref 8.4–10.5)
Creatinine, Ser: 0.84 mg/dL (ref 0.40–1.20)
GFR: 80.29 mL/min (ref >60.00)
GLUCOSE: 90 mg/dL (ref 70–99)
POTASSIUM: 4 meq/L (ref 3.5–5.1)
Sodium: 138 mEq/L (ref 135–145)
TOTAL PROTEIN: 7.1 g/dL (ref 6.0–8.3)
Total Bilirubin: 0.5 mg/dL (ref 0.2–1.2)

## 2017-03-12 LAB — LIPID PANEL
Cholesterol: 151 mg/dL (ref 0–200)
HDL: 85 mg/dL
LDL Cholesterol: 55 mg/dL (ref 0–99)
NonHDL: 65.97
Total CHOL/HDL Ratio: 2
Triglycerides: 55 mg/dL (ref 0.0–149.0)
VLDL: 11 mg/dL (ref 0.0–40.0)

## 2017-03-12 LAB — TSH: TSH: 2.68 u[IU]/mL (ref 0.35–4.50)

## 2018-03-09 ENCOUNTER — Other Ambulatory Visit (HOSPITAL_COMMUNITY)
Admission: RE | Admit: 2018-03-09 | Discharge: 2018-03-09 | Disposition: A | Discharge status: Home / Self Care | Payer: 59 | Source: Ambulatory Visit | Attending: Internal Medicine | Admitting: Internal Medicine

## 2018-03-09 ENCOUNTER — Ambulatory Visit (INDEPENDENT_AMBULATORY_CARE_PROVIDER_SITE_OTHER): Payer: 59 | Admitting: Internal Medicine

## 2018-03-09 VITALS — BP 120/68 | HR 51 | Temp 98.2°F | Resp 18 | Wt 138.8 lb

## 2018-03-09 DIAGNOSIS — Z9109 Other allergy status, other than to drugs and biological substances: Secondary | ICD-10-CM | POA: Diagnosis not present

## 2018-03-09 DIAGNOSIS — Z124 Encounter for screening for malignant neoplasm of cervix: Secondary | ICD-10-CM | POA: Insufficient documentation

## 2018-03-09 DIAGNOSIS — Z1239 Encounter for other screening for malignant neoplasm of breast: Secondary | ICD-10-CM

## 2018-03-09 DIAGNOSIS — Z0000 Encounter for general adult medical examination without abnormal findings: Secondary | ICD-10-CM

## 2018-03-09 DIAGNOSIS — Z8349 Family history of other endocrine, nutritional and metabolic diseases: Secondary | ICD-10-CM | POA: Diagnosis not present

## 2018-03-09 DIAGNOSIS — J4520 Mild intermittent asthma, uncomplicated: Secondary | ICD-10-CM | POA: Diagnosis not present

## 2018-03-09 DIAGNOSIS — G500 Trigeminal neuralgia: Secondary | ICD-10-CM

## 2018-03-09 LAB — TSH: TSH: 3.12 u[IU]/mL (ref 0.35–4.50)

## 2018-03-09 NOTE — Progress Notes (Signed)
Patient ID: Megan Frey, female   DOB: [birth year], 42 y.o.   MRN: [id number]   Subjective:    Patient ID: Megan Frey, female    DOB: 06-24-1976, 42 y.o.   MRN: [id number]  HPI  Patient here for her physical exam.  States she is doing well.  Stays active.  No chest pain.  No sob.  No acid reflux.  No abdominal pain.  Bowels moving.  No urine change.  Exercises regularly.  Training for a marathon.  Some changes of her left great toe nail.  Dark.  No pain.  Following.     Past Medical History:  Diagnosis Date  . Asthma   . Hx: UTI (urinary tract infection)    Past Surgical History:  Procedure Laterality Date  . HERNIA REPAIR  2011   with mesh   Family History  Problem Relation Age of Onset  . Asthma Father   . Mental illness Sister   . Cancer Maternal Grandfather        colon  . Sudden death Maternal Grandfather   . Cancer Paternal Grandfather        lung   Social History   Socioeconomic History  . Marital status: Married    Spouse name: Not on file  . Number of children: Not on file  . Years of education: Not on file  . Highest education level: Not on file  Occupational History  . Not on file  Social Needs  . Financial resource strain: Not on file  . Food insecurity:    Worry: Not on file    Inability: Not on file  . Transportation needs:    Medical: Not on file    Non-medical: Not on file  Tobacco Use  . Smoking status: Never Smoker  . Smokeless tobacco: Never Used  Substance and Sexual Activity  . Alcohol use: No    Alcohol/week: 0.0 oz  . Drug use: No  . Sexual activity: Not on file  Lifestyle  . Physical activity:    Days per week: Not on file    Minutes per session: Not on file  . Stress: Not on file  Relationships  . Social connections:    Talks on phone: Not on file    Gets together: Not on file    Attends religious service: Not on file    Active member of club or organization: Not on file    Attends meetings of clubs or organizations:  Not on file    Relationship status: Not on file  Other Topics Concern  . Not on file  Social History Narrative  . Not on file    Outpatient Encounter Medications as of 03/09/2018  Medication Sig  . albuterol (PROVENTIL HFA) 108 (90 Base) MCG/ACT inhaler Inhale 2 puffs into the lungs every 6 (six) hours as needed for wheezing or shortness of breath.  . fluticasone (FLONASE) 50 MCG/ACT nasal spray Place 2 sprays into both nostrils daily.  Marland Kitchen ibuprofen (ADVIL,MOTRIN) 200 MG tablet Take 400 mg by mouth 2 (two) times daily as needed.  . Loratadine (CLARITIN) 10 MG CAPS Take by mouth.  Marland Kitchen MAGNESIUM PO Take by mouth.   No facility-administered encounter medications on file as of 03/09/2018.     Review of Systems  Constitutional: Negative for appetite change and unexpected weight change.  HENT: Negative for congestion and sinus pressure.   Eyes: Negative for pain and visual disturbance.  Respiratory: Negative for cough, chest tightness and shortness of breath.  Cardiovascular: Negative for chest pain, palpitations and leg swelling.  Gastrointestinal: Negative for abdominal pain, diarrhea, nausea and vomiting.  Genitourinary: Negative for difficulty urinating and dysuria.  Musculoskeletal: Negative for joint swelling and myalgias.  Skin: Negative for color change and rash.  Neurological: Negative for dizziness, light-headedness and headaches.  Hematological: Negative for adenopathy. Does not bruise/bleed easily.  Psychiatric/Behavioral: Negative for agitation and dysphoric mood.       Objective:    Physical Exam  Constitutional: She is oriented to person, place, and time. She appears well-developed and well-nourished. No distress.  HENT:  Nose: Nose normal.  Mouth/Throat: Oropharynx is clear and moist.  Eyes: Right eye exhibits no discharge. Left eye exhibits no discharge. No scleral icterus.  Neck: Neck supple. No thyromegaly present.  Cardiovascular: Normal rate and regular rhythm.    Pulmonary/Chest: Breath sounds normal. No accessory muscle usage. No tachypnea. No respiratory distress. She has no decreased breath sounds. She has no wheezes. She has no rhonchi. Right breast exhibits no inverted nipple, no mass, no nipple discharge and no tenderness (no axillary adenopathy). Left breast exhibits no inverted nipple, no mass, no nipple discharge and no tenderness (no axilarry adenopathy).  Abdominal: Soft. Bowel sounds are normal. There is no tenderness.  Genitourinary:  Genitourinary Comments: Normal external genitalia.  Vaginal vault without lesions.  Cervix identified.  Pap smear performed.  Could not appreciate any adnexal masses or tenderness.    Musculoskeletal: She exhibits no edema or tenderness.  Lymphadenopathy:    She has no cervical adenopathy.  Neurological: She is alert and oriented to person, place, and time.  Skin: Skin is warm. No rash noted. No erythema.  Psychiatric: She has a normal mood and affect. Her behavior is normal.    BP 120/68 (BP Location: Left Arm, Patient Position: Sitting, Cuff Size: Normal)   Pulse (!) 51   Temp 98.2 F (36.8 C) (Oral)   Resp 18   Wt 138 lb 12.8 oz (63 kg)   SpO2 99%   BMI 23.46 kg/m  Wt Readings from Last 3 Encounters:  03/09/18 138 lb 12.8 oz (63 kg)  03/05/17 140 lb 9.6 oz (63.8 kg)  10/30/16 140 lb 3.2 oz (63.6 kg)     Lab Results  Component Value Date   WBC 9.2 03/12/2017   HGB 12.4 03/12/2017   HCT 37.1 03/12/2017   PLT 274.0 03/12/2017   GLUCOSE 90 03/12/2017   CHOL 151 03/12/2017   TRIG 55.0 03/12/2017   HDL 85.00 03/12/2017   LDLCALC 55 03/12/2017   ALT 15 03/12/2017   AST 22 03/12/2017   NA 138 03/12/2017   K 4.0 03/12/2017   CL 103 03/12/2017   CREATININE 0.84 03/12/2017   BUN 17 03/12/2017   CO2 28 03/12/2017   TSH 3.12 03/09/2018       Assessment & Plan:   Problem List Items Addressed This Visit    Asthma    Breathing doing well.  Follow.  Uses albuterol prn.         Environmental allergies    Controlled.        Health care maintenance    Physical today 03/09/18.  PAP 03/09/17 - negative with negative HPV.  Schedule mammogram.  Repeat pap today.        Trigeminal neuralgia    No problems now.  Follow.         Other Visit Diagnoses    Routine general medical examination at a health care facility    -  Primary   Breast cancer screening       Relevant Orders   MM SCREENING BREAST TOMO BILATERAL   Family history of thyroid disorder       check tsh.     Relevant Orders   TSH (Completed)   Cervical cancer screening       Relevant Orders   Cytology - PAP (Completed)       Dale DurhamSCOTT, Jayvyn Haselton, MD

## 2018-03-09 NOTE — Assessment & Plan Note (Addendum)
Physical today 03/09/18.  PAP 03/09/17 - negative with negative HPV.  Schedule mammogram.  Repeat pap today.

## 2018-03-11 LAB — CYTOLOGY - PAP
Diagnosis: NEGATIVE
HPV: NOT DETECTED

## 2018-03-14 ENCOUNTER — Encounter: Payer: Self-pay | Admitting: Internal Medicine

## 2018-03-14 NOTE — Assessment & Plan Note (Signed)
Breathing doing well.  Follow.  Uses albuterol prn.

## 2018-03-14 NOTE — Assessment & Plan Note (Signed)
Controlled.  

## 2018-03-14 NOTE — Assessment & Plan Note (Signed)
No problems now.  Follow.

## 2018-03-18 ENCOUNTER — Telehealth: Payer: Self-pay | Admitting: Internal Medicine

## 2018-03-18 NOTE — Telephone Encounter (Signed)
Left message for patient to call back on voice mail- see lab results

## 2018-03-18 NOTE — Telephone Encounter (Signed)
Copied from CRM [id number]. Topic: Quick Communication - Lab Results >> Mar 15, 2018  4:31 PM Dennie Bibleavis, Kathy R, LPN wrote: Called patient to inform them of 03/12/18 lab results. When patient returns call, triage nurse may disclose results.  >> Mar 18, 2018  8:53 AM Crist InfanteHarrald, Kathy J wrote: Pt calling back for lab results. Pt states ok to leave on VM if applicable.

## 2018-05-11 ENCOUNTER — Other Ambulatory Visit: Payer: Self-pay | Admitting: Internal Medicine

## 2018-05-11 ENCOUNTER — Ambulatory Visit
Admission: RE | Admit: 2018-05-11 | Discharge: 2018-05-11 | Disposition: A | Discharge status: Home / Self Care | Payer: 59 | Source: Ambulatory Visit | Attending: Internal Medicine | Admitting: Internal Medicine

## 2018-05-11 DIAGNOSIS — Z1239 Encounter for other screening for malignant neoplasm of breast: Secondary | ICD-10-CM

## 2018-05-11 DIAGNOSIS — Z1231 Encounter for screening mammogram for malignant neoplasm of breast: Secondary | ICD-10-CM | POA: Diagnosis present

## 2018-05-11 DIAGNOSIS — R928 Other abnormal and inconclusive findings on diagnostic imaging of breast: Secondary | ICD-10-CM

## 2018-05-11 NOTE — Progress Notes (Signed)
Order placed for f/u left breast mammogram and ultrasound.   

## 2018-05-14 ENCOUNTER — Ambulatory Visit
Admission: RE | Admit: 2018-05-14 | Discharge: 2018-05-14 | Disposition: A | Discharge status: Home / Self Care | Payer: 59 | Source: Ambulatory Visit | Attending: Internal Medicine | Admitting: Internal Medicine

## 2018-05-14 DIAGNOSIS — R928 Other abnormal and inconclusive findings on diagnostic imaging of breast: Secondary | ICD-10-CM | POA: Insufficient documentation

## 2018-06-10 ENCOUNTER — Other Ambulatory Visit: Payer: Self-pay | Admitting: Family Medicine

## 2018-06-10 DIAGNOSIS — J209 Acute bronchitis, unspecified: Secondary | ICD-10-CM

## 2018-09-09 ENCOUNTER — Telehealth: Payer: Self-pay | Admitting: Internal Medicine

## 2018-09-09 NOTE — Telephone Encounter (Signed)
Placed in quick sign folder for your signature

## 2018-09-09 NOTE — Telephone Encounter (Signed)
Patient is aware and form placed up front

## 2018-09-09 NOTE — Telephone Encounter (Signed)
Pt dropped off cpe form to be filled out.. Placed in Dr.Scott color folder upfront  Please advise pt when completed  Pt needs to have this completed by 9/27

## 2018-09-09 NOTE — Telephone Encounter (Signed)
Signed and placed in box.   

## 2019-03-11 ENCOUNTER — Encounter: Payer: 59 | Admitting: Internal Medicine

## 2019-04-28 ENCOUNTER — Encounter: Payer: Self-pay | Admitting: *Deleted

## 2019-07-11 ENCOUNTER — Other Ambulatory Visit: Payer: Self-pay

## 2019-07-11 ENCOUNTER — Encounter: Payer: Self-pay | Admitting: Internal Medicine

## 2019-07-11 ENCOUNTER — Ambulatory Visit (INDEPENDENT_AMBULATORY_CARE_PROVIDER_SITE_OTHER): Payer: Self-pay | Admitting: Internal Medicine

## 2019-07-11 VITALS — BP 122/62 | HR 49 | Temp 98.6°F | Resp 18 | Ht 65.0 in | Wt 139.5 lb

## 2019-07-11 DIAGNOSIS — J4520 Mild intermittent asthma, uncomplicated: Secondary | ICD-10-CM

## 2019-07-11 DIAGNOSIS — Z0000 Encounter for general adult medical examination without abnormal findings: Secondary | ICD-10-CM

## 2019-07-11 DIAGNOSIS — Z9109 Other allergy status, other than to drugs and biological substances: Secondary | ICD-10-CM

## 2019-07-11 LAB — LIPID PANEL
Cholesterol: 146 mg/dL (ref 0–200)
HDL: 86.6 mg/dL (ref >39.00)
LDL Cholesterol: 51 mg/dL (ref 0–99)
NonHDL: 59.09
Total CHOL/HDL Ratio: 2
Triglycerides: 40 mg/dL (ref 0.0–149.0)
VLDL: 8 mg/dL (ref 0.0–40.0)

## 2019-07-11 LAB — CBC WITH DIFFERENTIAL/PLATELET
Basophils Absolute: 0 10*3/uL (ref 0.0–0.1)
Basophils Relative: 0.5 % (ref 0.0–3.0)
Eosinophils Absolute: 0 10*3/uL (ref 0.0–0.7)
Eosinophils Relative: 0.5 % (ref 0.0–5.0)
HCT: 36.8 % (ref 36.0–46.0)
Hemoglobin: 12.2 g/dL (ref 12.0–15.0)
Lymphocytes Relative: 22 % (ref 12.0–46.0)
Lymphs Abs: 1.8 10*3/uL (ref 0.7–4.0)
MCHC: 33.3 g/dL (ref 30.0–36.0)
MCV: 93.2 fl (ref 78.0–100.0)
Monocytes Absolute: 0.4 10*3/uL (ref 0.1–1.0)
Monocytes Relative: 4.9 % (ref 3.0–12.0)
Neutro Abs: 5.9 10*3/uL (ref 1.4–7.7)
Neutrophils Relative %: 72.1 % (ref 43.0–77.0)
Platelets: 277 10*3/uL (ref 150.0–400.0)
RBC: 3.94 Mil/uL (ref 3.87–5.11)
RDW: 13.7 % (ref 11.5–15.5)
WBC: 8.1 10*3/uL (ref 4.0–10.5)

## 2019-07-11 LAB — COMPREHENSIVE METABOLIC PANEL
ALT: 18 U/L (ref 0–35)
AST: 22 U/L (ref 0–37)
Albumin: 4.3 g/dL (ref 3.5–5.2)
Alkaline Phosphatase: 44 U/L (ref 39–117)
BUN: 14 mg/dL (ref 6–23)
CO2: 27 mEq/L (ref 19–32)
Calcium: 9.8 mg/dL (ref 8.4–10.5)
Chloride: 103 mEq/L (ref 96–112)
Creatinine, Ser: 0.86 mg/dL (ref 0.40–1.20)
GFR: 72.65 mL/min (ref >60.00)
Glucose, Bld: 83 mg/dL (ref 70–99)
Potassium: 3.9 mEq/L (ref 3.5–5.1)
Sodium: 138 mEq/L (ref 135–145)
Total Bilirubin: 0.8 mg/dL (ref 0.2–1.2)
Total Protein: 7.2 g/dL (ref 6.0–8.3)

## 2019-07-11 LAB — TSH: TSH: 3.57 u[IU]/mL (ref 0.35–4.50)

## 2019-07-11 NOTE — Progress Notes (Signed)
Patient ID: Megan Frey, female   DOB: 1975/12/25, 43 y.o.   MRN: [id number]   Subjective:    Patient ID: Megan NipJaime P Frey, female    DOB: 1975/12/25, 43 y.o.   MRN: [id number]  HPI  Patient here for her physical exam.  She reports she is doing well.  Feels good.  Breathing doing well.  No problems with her asthma.  No increased cough or congestion.  Exercising.  No chest pain.  No sob.  No acid reflux.  No abdominal pain.  Bowels moving.  No urine change.     Past Medical History:  Diagnosis Date   Asthma    Hx: UTI (urinary tract infection)    Past Surgical History:  Procedure Laterality Date   HERNIA REPAIR  2011   with mesh   Family History  Problem Relation Age of Onset   Asthma Father    Mental illness Sister    Cancer Maternal Grandfather        colon   Sudden death Maternal Grandfather    Cancer Paternal Grandfather        lung   Breast cancer Cousin    Social History   Socioeconomic History   Marital status: Married    Spouse name: Not on file   Number of children: Not on file   Years of education: Not on file   Highest education level: Not on file  Occupational History   Not on file  Social Needs   Financial resource strain: Not on file   Food insecurity    Worry: Not on file    Inability: Not on file   Transportation needs    Medical: Not on file    Non-medical: Not on file  Tobacco Use   Smoking status: Never Smoker   Smokeless tobacco: Never Used  Substance and Sexual Activity   Alcohol use: No    Alcohol/week: 0.0 standard drinks   Drug use: No   Sexual activity: Not on file  Lifestyle   Physical activity    Days per week: Not on file    Minutes per session: Not on file   Stress: Not on file  Relationships   Social connections    Talks on phone: Not on file    Gets together: Not on file    Attends religious service: Not on file    Active member of club or organization: Not on file    Attends meetings of clubs  or organizations: Not on file    Relationship status: Not on file  Other Topics Concern   Not on file  Social History Narrative   Not on file    Outpatient Encounter Medications as of 07/11/2019  Medication Sig   albuterol (PROVENTIL HFA) 108 (90 Base) MCG/ACT inhaler Inhale 2 puffs into the lungs every 6 (six) hours as needed for wheezing or shortness of breath.   fluticasone (FLONASE) 50 MCG/ACT nasal spray Place 2 sprays into both nostrils daily.   ibuprofen (ADVIL,MOTRIN) 200 MG tablet Take 400 mg by mouth 2 (two) times daily as needed.   Loratadine (CLARITIN) 10 MG CAPS Take by mouth.   MAGNESIUM PO Take by mouth.   No facility-administered encounter medications on file as of 07/11/2019.     Review of Systems  Constitutional: Negative for appetite change and unexpected weight change.  HENT: Negative for congestion and sinus pressure.   Eyes: Negative for pain and visual disturbance.  Respiratory: Negative for cough, chest tightness and shortness  of breath.   Cardiovascular: Negative for chest pain, palpitations and leg swelling.  Gastrointestinal: Negative for abdominal pain, diarrhea, nausea and vomiting.  Genitourinary: Negative for difficulty urinating and dysuria.  Musculoskeletal: Negative for joint swelling and myalgias.  Skin: Negative for color change and rash.  Neurological: Negative for dizziness, light-headedness and headaches.  Hematological: Negative for adenopathy. Does not bruise/bleed easily.  Psychiatric/Behavioral: Negative for agitation and dysphoric mood.       Objective:    Physical Exam Constitutional:      General: She is not in acute distress.    Appearance: Normal appearance. She is well-developed.  HENT:     Right Ear: External ear normal.     Left Ear: External ear normal.  Eyes:     General: No scleral icterus.       Right eye: No discharge.        Left eye: No discharge.     Conjunctiva/sclera: Conjunctivae normal.  Neck:      Musculoskeletal: Neck supple. No muscular tenderness.     Thyroid: No thyromegaly.  Cardiovascular:     Rate and Rhythm: Normal rate and regular rhythm.  Pulmonary:     Effort: No tachypnea, accessory muscle usage or respiratory distress.     Breath sounds: Normal breath sounds. No decreased breath sounds or wheezing.  Chest:     Breasts:        Right: No inverted nipple, mass, nipple discharge or tenderness (no axillary adenopathy).        Left: No inverted nipple, mass, nipple discharge or tenderness (no axilarry adenopathy).  Abdominal:     General: Bowel sounds are normal.     Palpations: Abdomen is soft.     Tenderness: There is no abdominal tenderness.  Musculoskeletal:        General: No swelling or tenderness.  Lymphadenopathy:     Cervical: No cervical adenopathy.  Skin:    Findings: No erythema or rash.  Neurological:     Mental Status: She is alert and oriented to person, place, and time.  Psychiatric:        Mood and Affect: Mood normal.        Behavior: Behavior normal.     BP 122/62    Pulse (!) 49    Temp 98.6 F (37 C) (Oral)    Resp 18    Ht 5\' 5"  (1.651 m)    Wt 139 lb 8 oz (63.3 kg)    SpO2 99%    BMI 23.21 kg/m  Wt Readings from Last 3 Encounters:  07/11/19 139 lb 8 oz (63.3 kg)  03/09/18 138 lb 12.8 oz (63 kg)  03/05/17 140 lb 9.6 oz (63.8 kg)     Lab Results  Component Value Date   WBC 8.1 07/11/2019   HGB 12.2 07/11/2019   HCT 36.8 07/11/2019   PLT 277.0 07/11/2019   GLUCOSE 83 07/11/2019   CHOL 146 07/11/2019   TRIG 40.0 07/11/2019   HDL 86.60 07/11/2019   LDLCALC 51 07/11/2019   ALT 18 07/11/2019   AST 22 07/11/2019   NA 138 07/11/2019   K 3.9 07/11/2019   CL 103 07/11/2019   CREATININE 0.86 07/11/2019   BUN 14 07/11/2019   CO2 27 07/11/2019   TSH 3.57 07/11/2019    [place] Breast Ltd Uni Left Inc Axilla  Result Date: 05/14/2018 CLINICAL DATA:  Recall from screening mammography with tomosynthesis, possible asymmetry involving the  INNER subareolar LEFT breast visible only on the  CC images. EXAM: DIGITAL DIAGNOSTIC LEFT MAMMOGRAM WITH CAD AND TOMO ULTRASOUND LEFT BREAST COMPARISON:  Mammography 05/11/2018, 03/31/2014. No prior ultrasound. ACR Breast Density Category c: The breast tissue is heterogeneously dense, which may obscure small masses. FINDINGS: Tomosynthesis and synthesized spot-compression CC view the area of concern in the LEFT breast and a tomosynthesis and synthesized full field mediolateral view of the LEFT breast were obtained. The asymmetry in the INNER subareolar LEFT breast at ANTERIOR depth disperses with compression and there is no underlying mass or architectural distortion. The asymmetry is difficult to localize on the mediolateral image but is likely near 9 o'clock. No findings suspicious for malignancy in the LEFT breast. The full field mediolateral image was processed with CAD. Targeted LEFT breast ultrasound is performed, showing an oval circumscribed parallel nearly anechoic mass at the 10 o'clock position approximately 2 cm from the nipple at ANTERIOR depth, measuring approximately 3 x 4 x 4 mm, demonstrating posterior acoustic enhancement and no internal color Doppler flow. A vessel is present in the tissues immediately adjacent to the mass. The mass is within an [place] of fibroglandular tissue that extends to the skin surface in the periareolar location, corresponding to the mammographic finding. No suspicious solid mass or abnormal acoustic shadowing is identified. IMPRESSION: 1. Benign cyst within an [place] of normal fibroglandular tissue in the UPPER INNER quadrant of the LEFT breast at ANTERIOR depth, corresponding to the screening mammographic finding. 2. No mammographic or sonographic evidence of malignancy involving the LEFT breast. RECOMMENDATION: Screening mammogram in one year.(Code:SM-B-01Y) I have discussed the findings and recommendations with the patient. Results were also provided in writing at the  conclusion of the visit. If applicable, a reminder letter will be sent to the patient regarding the next appointment. BI-RADS CATEGORY  2: Benign. Electronically Signed   By: Evangeline Dakin M.D.   On: 05/14/2018 14:54   Mm Diag Breast Tomo Uni Left  Result Date: 05/14/2018 CLINICAL DATA:  Recall from screening mammography with tomosynthesis, possible asymmetry involving the INNER subareolar LEFT breast visible only on the CC images. EXAM: DIGITAL DIAGNOSTIC LEFT MAMMOGRAM WITH CAD AND TOMO ULTRASOUND LEFT BREAST COMPARISON:  Mammography 05/11/2018, 03/31/2014. No prior ultrasound. ACR Breast Density Category c: The breast tissue is heterogeneously dense, which may obscure small masses. FINDINGS: Tomosynthesis and synthesized spot-compression CC view the area of concern in the LEFT breast and a tomosynthesis and synthesized full field mediolateral view of the LEFT breast were obtained. The asymmetry in the INNER subareolar LEFT breast at ANTERIOR depth disperses with compression and there is no underlying mass or architectural distortion. The asymmetry is difficult to localize on the mediolateral image but is likely near 9 o'clock. No findings suspicious for malignancy in the LEFT breast. The full field mediolateral image was processed with CAD. Targeted LEFT breast ultrasound is performed, showing an oval circumscribed parallel nearly anechoic mass at the 10 o'clock position approximately 2 cm from the nipple at ANTERIOR depth, measuring approximately 3 x 4 x 4 mm, demonstrating posterior acoustic enhancement and no internal color Doppler flow. A vessel is present in the tissues immediately adjacent to the mass. The mass is within an [place] of fibroglandular tissue that extends to the skin surface in the periareolar location, corresponding to the mammographic finding. No suspicious solid mass or abnormal acoustic shadowing is identified. IMPRESSION: 1. Benign cyst within an [place] of normal fibroglandular  tissue in the UPPER INNER quadrant of the LEFT breast at ANTERIOR depth, corresponding to the screening mammographic  finding. 2. No mammographic or sonographic evidence of malignancy involving the LEFT breast. RECOMMENDATION: Screening mammogram in one year.(Code:SM-B-01Y) I have discussed the findings and recommendations with the patient. Results were also provided in writing at the conclusion of the visit. If applicable, a reminder letter will be sent to the patient regarding the next appointment. BI-RADS CATEGORY  2: Benign. Electronically Signed   By: Hulan Saashomas  Lawrence M.D.   On: 05/14/2018 14:54       Assessment & Plan:   Problem List Items Addressed This Visit    Asthma    Breathing stable.  Doing well.  Follow.        Environmental allergies    Controlled.        Health care maintenance    Physical today 07/11/19.  PAP 03/09/18 - negative with negative HPV.  Schedule mammogram.         Other Visit Diagnoses    Routine general medical examination at a health care facility    -  Primary   Relevant Orders   CBC with Differential/Platelet (Completed)   Comprehensive metabolic panel (Completed)   Lipid panel (Completed)   TSH (Completed)       Dale Durhamharlene Nissan Frazzini, MD

## 2019-07-12 ENCOUNTER — Encounter: Payer: Self-pay | Admitting: Internal Medicine

## 2019-07-16 ENCOUNTER — Encounter: Payer: Self-pay | Admitting: Internal Medicine

## 2019-07-16 NOTE — Assessment & Plan Note (Signed)
Breathing stable.  Doing well.  Follow.  

## 2019-07-16 NOTE — Assessment & Plan Note (Signed)
Controlled.  

## 2019-07-16 NOTE — Assessment & Plan Note (Signed)
Physical today 07/11/19.  PAP 03/09/18 - negative with negative HPV.  Schedule mammogram.

## 2019-07-18 ENCOUNTER — Encounter: Payer: Self-pay | Admitting: Internal Medicine

## 2020-04-03 ENCOUNTER — Other Ambulatory Visit: Payer: Self-pay | Admitting: Internal Medicine

## 2020-04-03 DIAGNOSIS — Z1231 Encounter for screening mammogram for malignant neoplasm of breast: Secondary | ICD-10-CM

## 2020-04-04 ENCOUNTER — Encounter: Payer: Self-pay | Admitting: Internal Medicine

## 2020-05-23 ENCOUNTER — Ambulatory Visit
Admission: RE | Admit: 2020-05-23 | Discharge: 2020-05-23 | Disposition: A | Discharge status: Home / Self Care | Payer: Self-pay | Source: Ambulatory Visit | Attending: Internal Medicine | Admitting: Internal Medicine

## 2020-05-23 DIAGNOSIS — Z1231 Encounter for screening mammogram for malignant neoplasm of breast: Secondary | ICD-10-CM | POA: Insufficient documentation

## 2020-05-24 ENCOUNTER — Other Ambulatory Visit: Payer: Self-pay | Admitting: Internal Medicine

## 2020-05-24 DIAGNOSIS — R2232 Localized swelling, mass and lump, left upper limb: Secondary | ICD-10-CM

## 2020-05-24 DIAGNOSIS — R928 Other abnormal and inconclusive findings on diagnostic imaging of breast: Secondary | ICD-10-CM

## 2020-05-29 ENCOUNTER — Other Ambulatory Visit: Payer: Self-pay | Admitting: Internal Medicine

## 2020-05-29 ENCOUNTER — Ambulatory Visit
Admission: RE | Admit: 2020-05-29 | Discharge: 2020-05-29 | Disposition: A | Discharge status: Home / Self Care | Payer: Self-pay | Source: Ambulatory Visit | Attending: Internal Medicine | Admitting: Internal Medicine

## 2020-05-29 DIAGNOSIS — R928 Other abnormal and inconclusive findings on diagnostic imaging of breast: Secondary | ICD-10-CM

## 2020-05-29 DIAGNOSIS — R2232 Localized swelling, mass and lump, left upper limb: Secondary | ICD-10-CM | POA: Insufficient documentation

## 2020-05-29 NOTE — Progress Notes (Signed)
Order placed for f/u ultrasound

## 2020-07-11 ENCOUNTER — Ambulatory Visit (INDEPENDENT_AMBULATORY_CARE_PROVIDER_SITE_OTHER): Payer: Self-pay | Admitting: Internal Medicine

## 2020-07-11 ENCOUNTER — Other Ambulatory Visit: Payer: Self-pay

## 2020-07-11 VITALS — BP 116/66 | HR 52 | Temp 98.3°F | Resp 16 | Ht 65.0 in | Wt 135.2 lb

## 2020-07-11 DIAGNOSIS — R5383 Other fatigue: Secondary | ICD-10-CM | POA: Insufficient documentation

## 2020-07-11 DIAGNOSIS — R42 Dizziness and giddiness: Secondary | ICD-10-CM | POA: Insufficient documentation

## 2020-07-11 DIAGNOSIS — Z0000 Encounter for general adult medical examination without abnormal findings: Secondary | ICD-10-CM

## 2020-07-11 DIAGNOSIS — J4520 Mild intermittent asthma, uncomplicated: Secondary | ICD-10-CM

## 2020-07-11 LAB — CBC WITH DIFFERENTIAL/PLATELET
Basophils Absolute: 0.1 10*3/uL (ref 0.0–0.1)
Basophils Relative: 0.7 % (ref 0.0–3.0)
Eosinophils Absolute: 0.1 10*3/uL (ref 0.0–0.7)
Eosinophils Relative: 0.9 % (ref 0.0–5.0)
HCT: 39 % (ref 36.0–46.0)
Hemoglobin: 13.2 g/dL (ref 12.0–15.0)
Lymphocytes Relative: 26 % (ref 12.0–46.0)
Lymphs Abs: 2.2 10*3/uL (ref 0.7–4.0)
MCHC: 33.7 g/dL (ref 30.0–36.0)
MCV: 92.3 fl (ref 78.0–100.0)
Monocytes Absolute: 0.4 10*3/uL (ref 0.1–1.0)
Monocytes Relative: 5.1 % (ref 3.0–12.0)
Neutro Abs: 5.7 10*3/uL (ref 1.4–7.7)
Neutrophils Relative %: 67.3 % (ref 43.0–77.0)
Platelets: 302 10*3/uL (ref 150.0–400.0)
RBC: 4.23 Mil/uL (ref 3.87–5.11)
RDW: 13.5 % (ref 11.5–15.5)
WBC: 8.4 10*3/uL (ref 4.0–10.5)

## 2020-07-11 LAB — FERRITIN: Ferritin: 21.3 ng/mL (ref 10.0–291.0)

## 2020-07-11 NOTE — Assessment & Plan Note (Signed)
Physical today 07/11/20.  PAP 03/09/18 - negative with negative HPV.  Mammogram 05/2020 recommended f/u left breast ultrasound.  Left breast ultrasound - probable reactive lymph node.  Recommended f/u left breast ultrasound in 3 months.  Discussed with her.  She will schedule.

## 2020-07-11 NOTE — Progress Notes (Signed)
Patient ID: Megan Frey, female   DOB: 03/29/1976, 44 y.o.   MRN: [id number]   Subjective:    Patient ID: Megan Frey, female    DOB: 1976-02-27, 44 y.o.   MRN: [id number]  HPI This visit occurred during the SARS-CoV-2 public health emergency.  Safety protocols were in place, including screening questions prior to the visit, additional usage of staff PPE, and extensive cleaning of exam room while observing appropriate contact time as indicated for disinfecting solutions.  Patient here for her physical exam.  She reports she is doing relatively well.  Increased stress.  Discussed with her.  Overall she feels she is handling things relatively well.  Some increased fatigue.  Discussed possible etiologies.  s still exercising.  Runs 30 miles per week.  Plays tennis.  Discussed w/up including labs.  Wants to minimize testing at this time and follow.  Will check cbc and iron.  Periods regular.  Yesterday noticed a little dizziness.  Resolved now.  No significant increased heart rate or palpitations.  No depression.     Past Medical History:  Diagnosis Date  . Asthma   . Hx: UTI (urinary tract infection)    Past Surgical History:  Procedure Laterality Date  . HERNIA REPAIR  2011   with mesh   Family History  Problem Relation Age of Onset  . Asthma Father   . Mental illness Sister   . Cancer Maternal Grandfather        colon  . Sudden death Maternal Grandfather   . Cancer Paternal Grandfather        lung  . Breast cancer Cousin    Social History   Socioeconomic History  . Marital status: Married    Spouse name: Not on file  . Number of children: Not on file  . Years of education: Not on file  . Highest education level: Not on file  Occupational History  . Not on file  Tobacco Use  . Smoking status: Never Smoker  . Smokeless tobacco: Never Used  Substance and Sexual Activity  . Alcohol use: No    Alcohol/week: 0.0 standard drinks  . Drug use: No  . Sexual activity: Not on  file  Other Topics Concern  . Not on file  Social History Narrative  . Not on file   Social Determinants of Health   Financial Resource Strain:   . Difficulty of Paying Living Expenses:   Food Insecurity:   . Worried About Programme researcher, broadcasting/film/video in the Last Year:   . Barista in the Last Year:   Transportation Needs:   . Freight forwarder (Medical):   Marland Kitchen Lack of Transportation (Non-Medical):   Physical Activity:   . Days of Exercise per Week:   . Minutes of Exercise per Session:   Stress:   . Feeling of Stress :   Social Connections:   . Frequency of Communication with Friends and Family:   . Frequency of Social Gatherings with Friends and Family:   . Attends Religious Services:   . Active Member of Clubs or Organizations:   . Attends Banker Meetings:   Marland Kitchen Marital Status:     Outpatient Encounter Medications as of 07/11/2020  Medication Sig  . albuterol (PROVENTIL HFA) 108 (90 Base) MCG/ACT inhaler Inhale 2 puffs into the lungs every 6 (six) hours as needed for wheezing or shortness of breath.  . fluticasone (FLONASE) 50 MCG/ACT nasal spray Place 2 sprays into both  nostrils daily.  Marland Kitchen ibuprofen (ADVIL,MOTRIN) 200 MG tablet Take 400 mg by mouth 2 (two) times daily as needed.  . Loratadine (CLARITIN) 10 MG CAPS Take by mouth.  Marland Kitchen MAGNESIUM PO Take by mouth.   No facility-administered encounter medications on file as of 07/11/2020.    Review of Systems  Constitutional: Negative for appetite change and unexpected weight change.  HENT: Negative for congestion and sinus pressure.   Eyes: Negative for pain and visual disturbance.  Respiratory: Negative for cough, chest tightness and shortness of breath.   Cardiovascular: Negative for chest pain, palpitations and leg swelling.  Gastrointestinal: Negative for abdominal pain, diarrhea, nausea and vomiting.  Genitourinary: Negative for difficulty urinating and dysuria.  Musculoskeletal: Negative for joint  swelling and myalgias.  Skin: Negative for color change and rash.  Neurological: Negative for dizziness, light-headedness and headaches.  Hematological: Negative for adenopathy. Does not bruise/bleed easily.  Psychiatric/Behavioral: Negative for agitation and dysphoric mood.       Objective:    Physical Exam Vitals reviewed.  Constitutional:      General: She is not in acute distress.    Appearance: Normal appearance. She is well-developed.  HENT:     Head: Normocephalic and atraumatic.     Right Ear: External ear normal.     Left Ear: External ear normal.  Eyes:     General: No scleral icterus.       Right eye: No discharge.        Left eye: No discharge.     Conjunctiva/sclera: Conjunctivae normal.  Neck:     Thyroid: No thyromegaly.  Cardiovascular:     Rate and Rhythm: Normal rate and regular rhythm.  Pulmonary:     Effort: No tachypnea, accessory muscle usage or respiratory distress.     Breath sounds: Normal breath sounds. No decreased breath sounds or wheezing.  Chest:     Breasts:        Right: No inverted nipple, mass, nipple discharge or tenderness (no axillary adenopathy).        Left: No inverted nipple, mass, nipple discharge or tenderness (no axilarry adenopathy).  Abdominal:     General: Bowel sounds are normal.     Palpations: Abdomen is soft.     Tenderness: There is no abdominal tenderness.  Musculoskeletal:        General: No swelling or tenderness.     Cervical back: Neck supple. No tenderness.  Lymphadenopathy:     Cervical: No cervical adenopathy.  Skin:    Findings: No erythema or rash.  Neurological:     Mental Status: She is alert and oriented to person, place, and time.  Psychiatric:        Mood and Affect: Mood normal.        Behavior: Behavior normal.     BP 116/66   Pulse 52   Temp 98.3 F (36.8 C)   Resp 16   Ht 5\' 5"  (1.651 m)   Wt 135 lb 3.2 oz (61.3 kg)   SpO2 99%   BMI 22.50 kg/m  Wt Readings from Last 3 Encounters:    07/11/20 135 lb 3.2 oz (61.3 kg)  07/11/19 139 lb 8 oz (63.3 kg)  03/09/18 138 lb 12.8 oz (63 kg)     Lab Results  Component Value Date   WBC 8.4 07/11/2020   HGB 13.2 07/11/2020   HCT 39.0 07/11/2020   PLT 302.0 07/11/2020   GLUCOSE 83 07/11/2019   CHOL 146 07/11/2019  TRIG 40.0 07/11/2019   HDL 86.60 07/11/2019   LDLCALC 51 07/11/2019   ALT 18 07/11/2019   AST 22 07/11/2019   NA 138 07/11/2019   K 3.9 07/11/2019   CL 103 07/11/2019   CREATININE 0.86 07/11/2019   BUN 14 07/11/2019   CO2 27 07/11/2019   TSH 3.57 07/11/2019    [place] BREAST LTD UNI LEFT INC AXILLA  Result Date: 05/29/2020 CLINICAL DATA:  Screening recall for possible abnormal lymph node in the left axilla. The patient did receive her COVID vaccination approximately 6 weeks prior to her mammogram. She does state that she experienced pain in the left axilla following the vaccination. EXAM: ULTRASOUND OF THE LEFT BREAST COMPARISON:  Previous exams. FINDINGS: Targeted ultrasound of the left axilla was performed. There is a 1.5 cm lymph node in the left axilla containing an echogenic/fatty hila and smooth cortex measuring 0.3 cm. This is felt to correspond with the lymph node seen in the left axilla, likely a reactive lymph node. IMPRESSION: Probably benign likely reactive left axillary lymph node. RECOMMENDATION: Recommend follow-up ultrasound in 3 months to demonstrate stability/normalization of the left axillary lymph node. I have discussed the findings and recommendations with the patient. If applicable, a reminder letter will be sent to the patient regarding the next appointment. BI-RADS CATEGORY  3: Probably benign. Electronically Signed   By: Edwin Cap M.D.   On: 05/29/2020 15:27       Assessment & Plan:   Problem List Items Addressed This Visit    Asthma    Breathing stable.       Dizziness    Has not been a constant issue for her.  Noticed yesterday.  Blood pressure ok.  Still exercising.  No  headache.  Follow.        Fatigue    Agreed to lab:  cbc and iron studies.  Hold on further testing.  She is still exercising as outlined.  Follow symptoms.         Relevant Orders   Ferritin (Completed)   CBC with Differential/Platelet (Completed)   Health care maintenance    Physical today 07/11/20.  PAP 03/09/18 - negative with negative HPV.  Mammogram 05/2020 recommended f/u left breast ultrasound.  Left breast ultrasound - probable reactive lymph node.  Recommended f/u left breast ultrasound in 3 months.  Discussed with her.  She will schedule.         Other Visit Diagnoses    Routine general medical examination at a health care facility    -  Primary       Dale [place], MD

## 2020-07-12 ENCOUNTER — Encounter: Payer: Self-pay | Admitting: Internal Medicine

## 2020-07-12 DIAGNOSIS — R5383 Other fatigue: Secondary | ICD-10-CM

## 2020-07-14 NOTE — Telephone Encounter (Signed)
Order placed for labs.

## 2020-07-16 ENCOUNTER — Encounter: Payer: Self-pay | Admitting: Internal Medicine

## 2020-07-22 ENCOUNTER — Encounter: Payer: Self-pay | Admitting: Internal Medicine

## 2020-07-22 NOTE — Assessment & Plan Note (Signed)
Breathing stable.

## 2020-07-22 NOTE — Assessment & Plan Note (Signed)
Agreed to lab:  cbc and iron studies.  Hold on further testing.  She is still exercising as outlined.  Follow symptoms.

## 2020-07-22 NOTE — Assessment & Plan Note (Signed)
Has not been a constant issue for her.  Noticed yesterday.  Blood pressure ok.  Still exercising.  No headache.  Follow.

## 2020-08-02 ENCOUNTER — Telehealth: Payer: Self-pay | Admitting: Internal Medicine

## 2020-08-02 NOTE — Telephone Encounter (Signed)
lft vm for pt to call ofc to sch Breast US

## 2020-11-01 ENCOUNTER — Encounter: Payer: Self-pay | Admitting: Internal Medicine

## 2020-11-01 NOTE — Telephone Encounter (Signed)
LMTCB. Need to schedule pt for a lab appt. Labs have been ordered?

## 2020-11-01 NOTE — Telephone Encounter (Signed)
You ordered thyroid labs on her in July. There was no appt scheduled. Are you ok with me scheduling her to come in and have them done now?

## 2020-11-01 NOTE — Telephone Encounter (Signed)
You had said to ignore this message to me, so this may be a duplicate for you.   I am ok with scheduling lab appt

## 2020-11-02 ENCOUNTER — Other Ambulatory Visit: Payer: Self-pay

## 2020-11-02 ENCOUNTER — Other Ambulatory Visit (INDEPENDENT_AMBULATORY_CARE_PROVIDER_SITE_OTHER): Payer: Self-pay

## 2020-11-02 DIAGNOSIS — R5383 Other fatigue: Secondary | ICD-10-CM

## 2020-11-02 LAB — TSH: TSH: 2.46 u[IU]/mL (ref 0.35–4.50)

## 2020-11-02 LAB — T4, FREE: Free T4: 0.83 ng/dL (ref 0.60–1.60)

## 2021-04-05 ENCOUNTER — Ambulatory Visit: Payer: Self-pay | Admitting: Family Medicine

## 2021-07-12 ENCOUNTER — Encounter: Payer: Self-pay | Admitting: Internal Medicine

## 2021-09-30 IMAGING — MG DIGITAL SCREENING BILAT W/ TOMO W/ CAD
6 of 10 series · 6 of 30 positions shown · non-contrast
Comparison: Previous exam(s).

CLINICAL DATA: Screening.

EXAM:
DIGITAL SCREENING BILATERAL MAMMOGRAM WITH TOMO AND CAD

[R MLO synth-2D (1 of 2)]
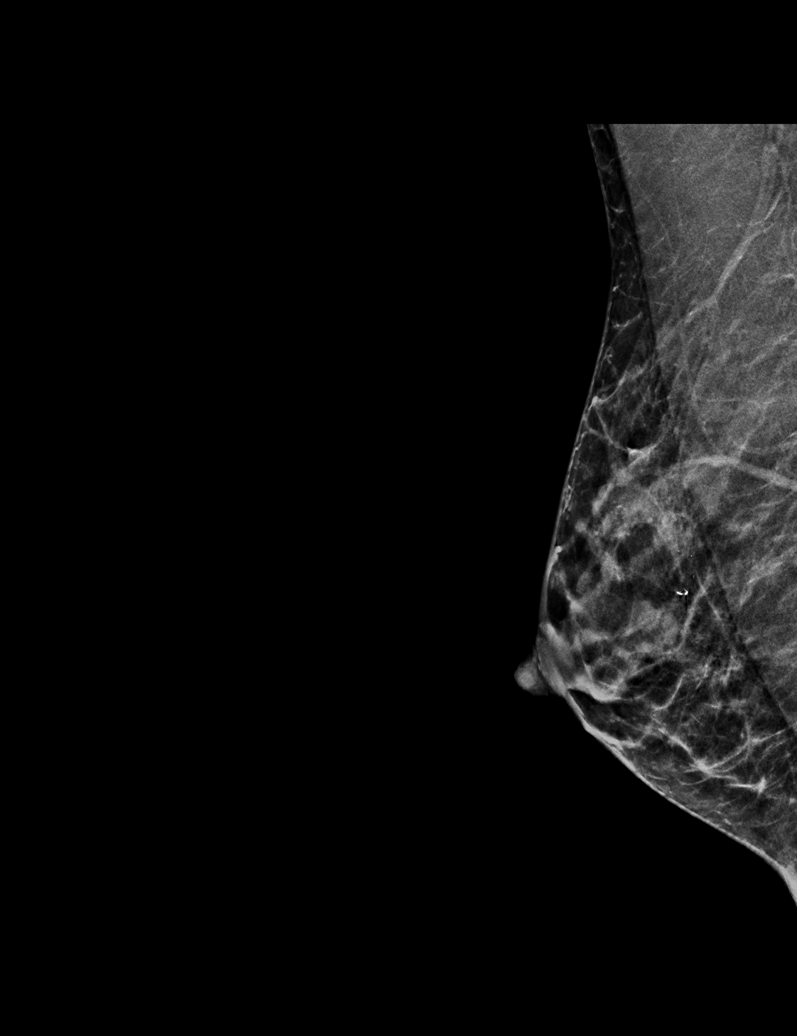

[R CC synth-2D]
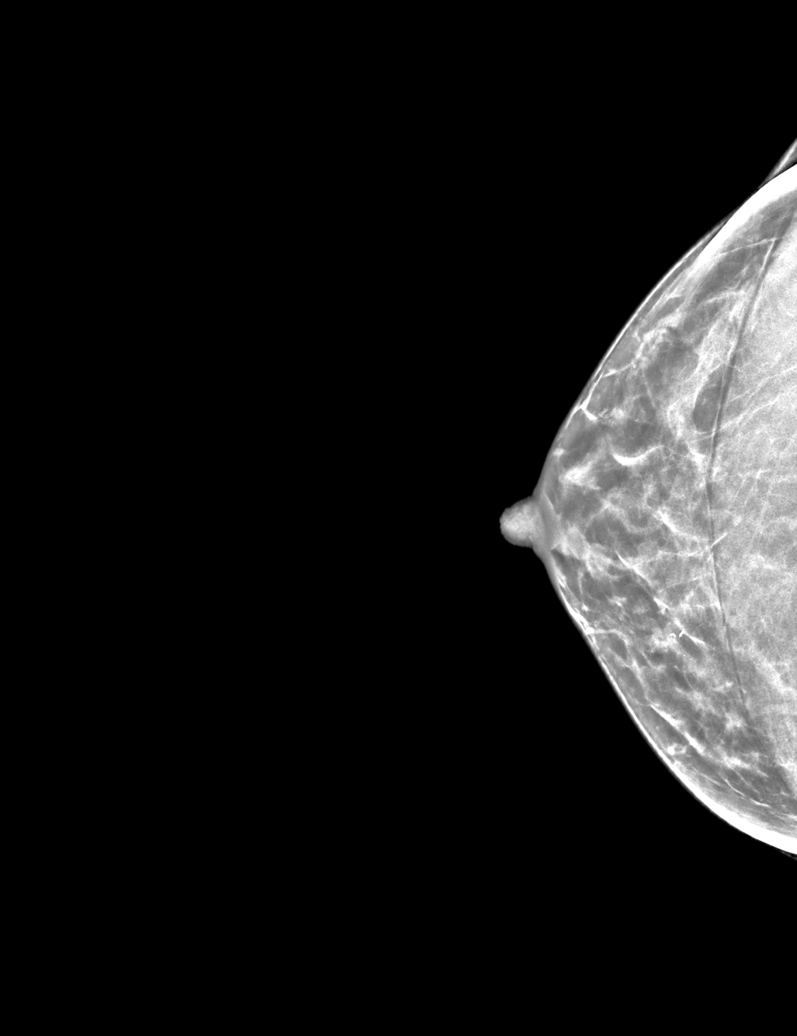

[L CC synth-2D]
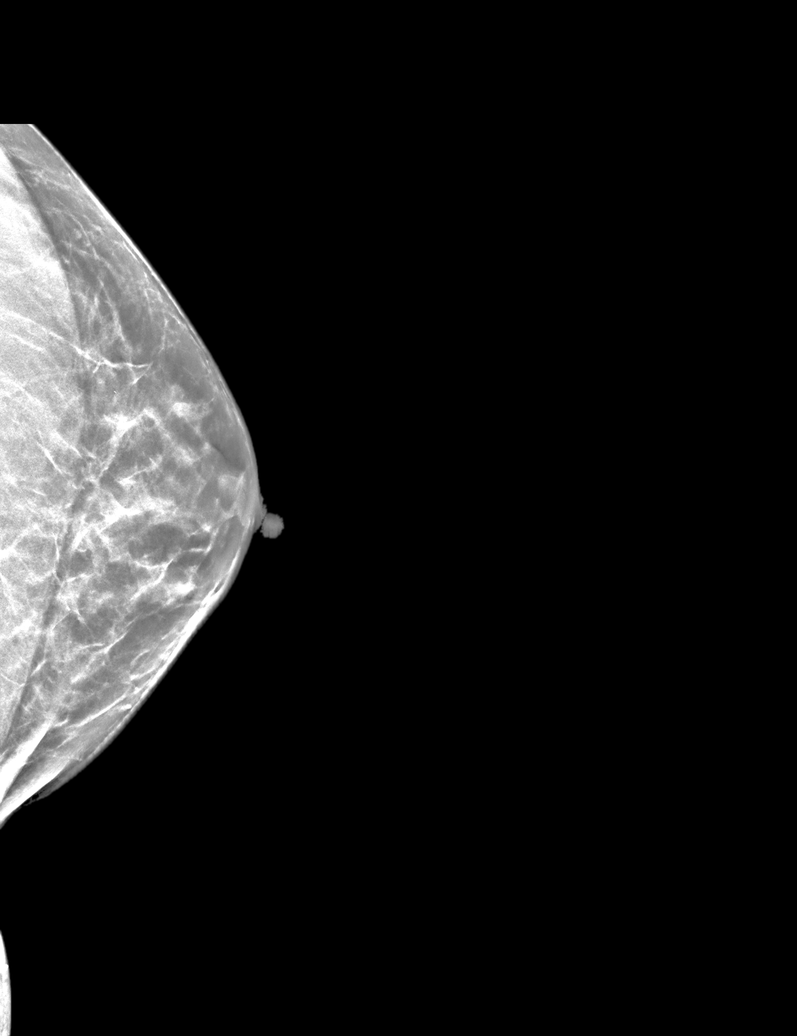

[R MLO synth-2D (2 of 2)]
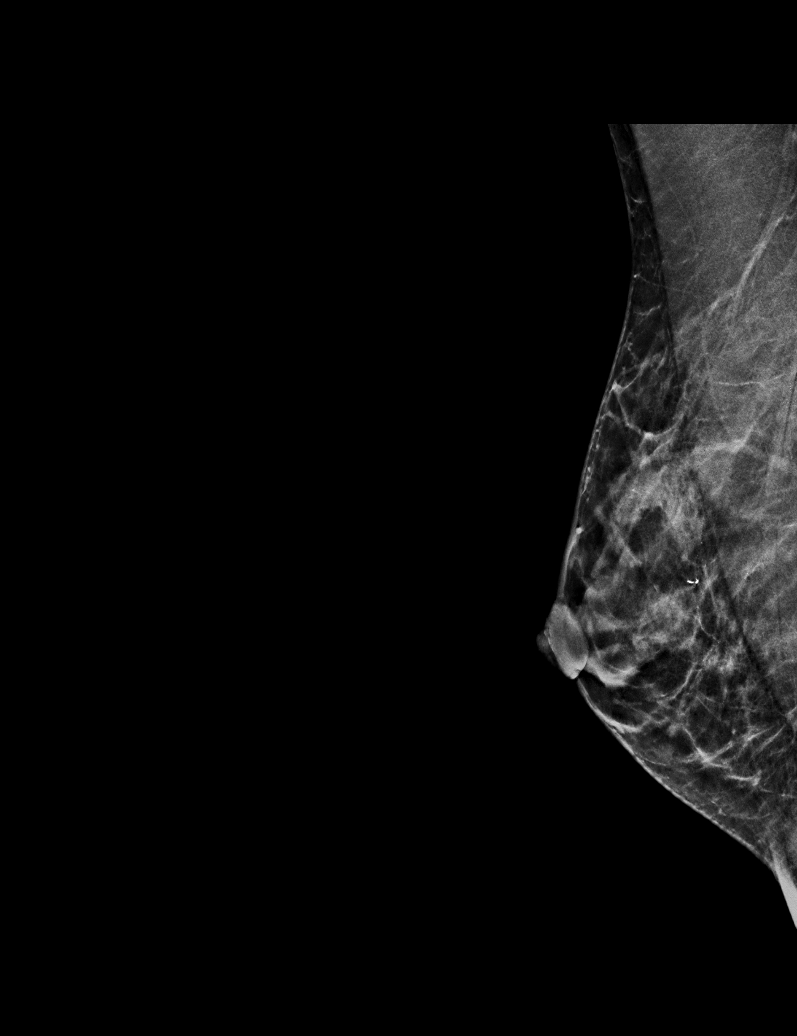

[L MLO synth-2D]
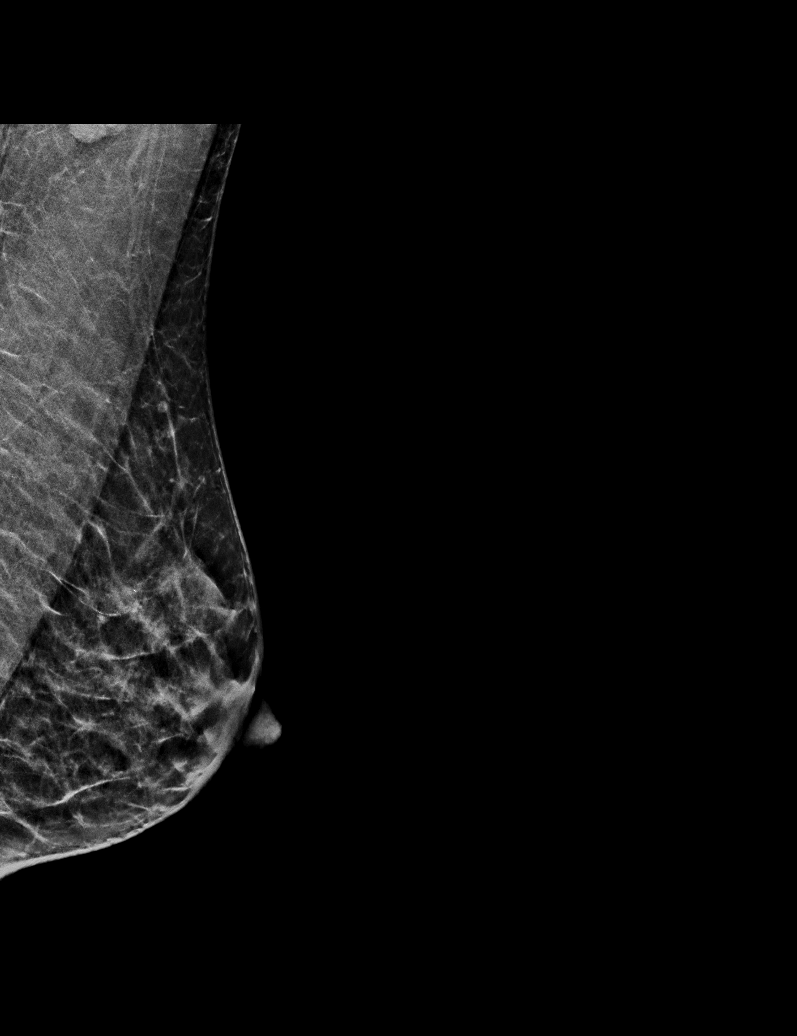

[R CC tomo · tomo slice 25/48.0]
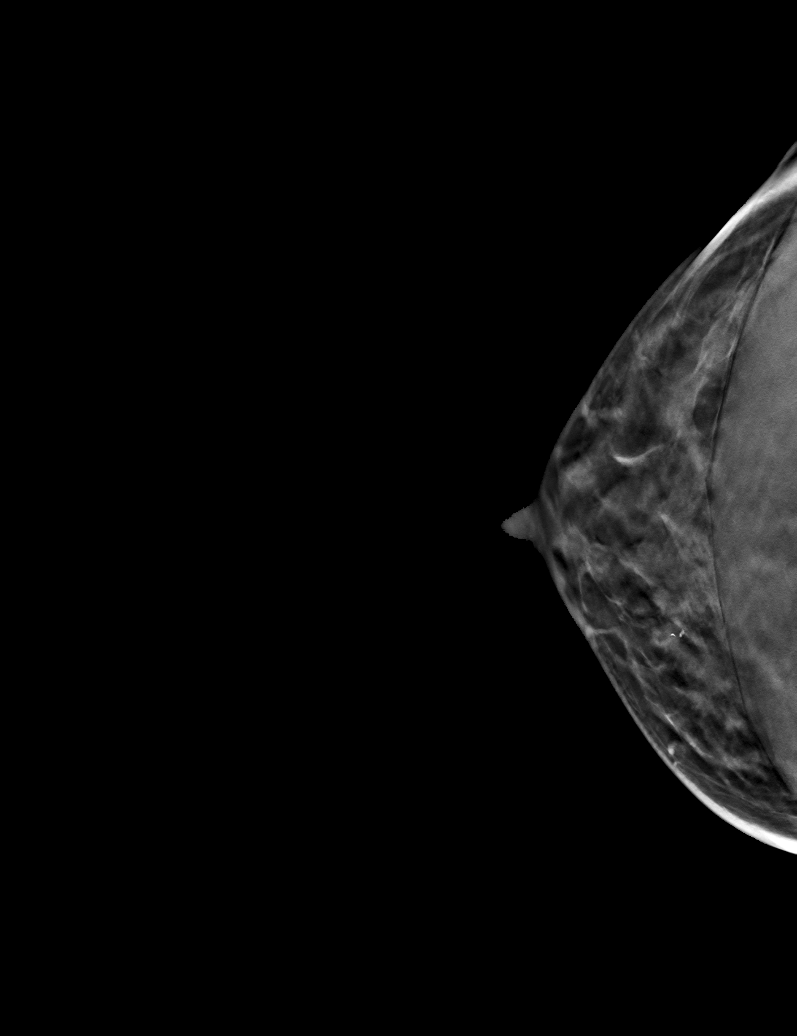

[6 of 30 positions shown; findings below may reference images not displayed]

ACR Breast Density Category c: The breast tissue is heterogeneously
dense, which may obscure small masses.
FINDINGS: In the left axilla, a possible mass warrants further evaluation. In
the right breast, no findings suspicious for malignancy.

Images were processed with CAD.
IMPRESSION: Further evaluation is suggested for possible mass in the left
axilla.

RECOMMENDATION:
Ultrasound of the left axilla. (Code:2X-U-44D)

The patient will be contacted regarding the findings, and additional
imaging will be scheduled.

BI-RADS CATEGORY  0: Incomplete. Need additional imaging evaluation
and/or prior mammograms for comparison.

## 2021-11-12 ENCOUNTER — Other Ambulatory Visit: Payer: Self-pay | Admitting: Internal Medicine

## 2021-11-12 DIAGNOSIS — Z1231 Encounter for screening mammogram for malignant neoplasm of breast: Secondary | ICD-10-CM

## 2022-10-02 ENCOUNTER — Other Ambulatory Visit: Payer: Self-pay | Admitting: Internal Medicine

## 2022-10-02 DIAGNOSIS — R928 Other abnormal and inconclusive findings on diagnostic imaging of breast: Secondary | ICD-10-CM

## 2022-10-24 ENCOUNTER — Ambulatory Visit
Admission: RE | Admit: 2022-10-24 | Discharge: 2022-10-24 | Disposition: A | Discharge status: Home / Self Care | Payer: Self-pay | Source: Ambulatory Visit | Attending: Internal Medicine | Admitting: Internal Medicine

## 2022-10-24 ENCOUNTER — Other Ambulatory Visit: Payer: Self-pay | Admitting: Internal Medicine

## 2022-10-24 DIAGNOSIS — R928 Other abnormal and inconclusive findings on diagnostic imaging of breast: Secondary | ICD-10-CM | POA: Insufficient documentation

## 2024-01-08 ENCOUNTER — Other Ambulatory Visit: Payer: Self-pay | Admitting: Internal Medicine

## 2024-01-08 DIAGNOSIS — Z1231 Encounter for screening mammogram for malignant neoplasm of breast: Secondary | ICD-10-CM

## 2024-01-20 ENCOUNTER — Ambulatory Visit
Admission: RE | Admit: 2024-01-20 | Discharge: 2024-01-20 | Disposition: A | Discharge status: Home / Self Care | Payer: 59 | Source: Ambulatory Visit | Attending: Internal Medicine | Admitting: Internal Medicine

## 2024-01-20 DIAGNOSIS — Z1231 Encounter for screening mammogram for malignant neoplasm of breast: Secondary | ICD-10-CM | POA: Insufficient documentation

## 2024-04-29 LAB — COLOGUARD: COLOGUARD: NEGATIVE

## 2024-06-22 ENCOUNTER — Other Ambulatory Visit: Payer: Self-pay | Admitting: Nurse Practitioner

## 2024-06-22 DIAGNOSIS — R112 Nausea with vomiting, unspecified: Secondary | ICD-10-CM

## 2024-06-22 MED ORDER — ONDANSETRON 4 MG PO TBDP: 4.0000 mg | ORAL_TABLET | Freq: Three times a day (TID) | ORAL | 0 refills | Status: AC | PRN

## 2024-12-02 ENCOUNTER — Other Ambulatory Visit: Payer: Self-pay | Admitting: Nurse Practitioner

## 2024-12-02 DIAGNOSIS — Z20828 Contact with and (suspected) exposure to other viral communicable diseases: Secondary | ICD-10-CM

## 2024-12-02 MED ORDER — OSELTAMIVIR PHOSPHATE 75 MG PO CAPS: 75.0000 mg | ORAL_CAPSULE | Freq: Every day | ORAL | 0 refills | Status: AC
